# Patient Record
Sex: Male | Born: 1970
Health system: Southern US, Community
[De-identification: ages and names within clinical notes are randomized; demographics above are authoritative.]

## PROBLEM LIST (undated history)

## (undated) HISTORY — PX: APPENDECTOMY: SHX54

---

## 2002-07-25 ENCOUNTER — Ambulatory Visit (HOSPITAL_COMMUNITY): Admission: RE | Admit: 2002-07-25 | Discharge: 2002-07-25 | Payer: Self-pay | Admitting: Nurse Practitioner

## 2002-07-25 ENCOUNTER — Emergency Department (HOSPITAL_COMMUNITY): Admission: EM | Admit: 2002-07-25 | Discharge: 2002-07-25 | Payer: Self-pay | Admitting: Emergency Medicine

## 2002-07-25 ENCOUNTER — Encounter: Payer: Self-pay | Admitting: Family Medicine

## 2006-11-27 ENCOUNTER — Emergency Department (HOSPITAL_COMMUNITY): Admission: EM | Admit: 2006-11-27 | Discharge: 2006-11-27 | Payer: Self-pay | Admitting: Emergency Medicine

## 2006-11-30 ENCOUNTER — Emergency Department (HOSPITAL_COMMUNITY): Admission: EM | Admit: 2006-11-30 | Discharge: 2006-11-30 | Payer: Self-pay | Admitting: Emergency Medicine

## 2006-12-04 ENCOUNTER — Encounter (HOSPITAL_COMMUNITY): Admission: RE | Admit: 2006-12-04 | Discharge: 2007-02-27 | Payer: Self-pay | Admitting: Emergency Medicine

## 2013-02-12 ENCOUNTER — Encounter: Payer: Self-pay | Admitting: Family Medicine

## 2013-02-12 ENCOUNTER — Ambulatory Visit (INDEPENDENT_AMBULATORY_CARE_PROVIDER_SITE_OTHER): Payer: BC Managed Care – PPO | Admitting: Family Medicine

## 2013-02-12 VITALS — BP 126/90 | HR 64 | Temp 98.6°F | Resp 14 | Ht 69.0 in | Wt 228.0 lb

## 2013-02-12 DIAGNOSIS — R519 Headache, unspecified: Secondary | ICD-10-CM | POA: Insufficient documentation

## 2013-02-12 DIAGNOSIS — M542 Cervicalgia: Secondary | ICD-10-CM

## 2013-02-12 DIAGNOSIS — G43809 Other migraine, not intractable, without status migrainosus: Secondary | ICD-10-CM

## 2013-02-12 DIAGNOSIS — R51 Headache: Secondary | ICD-10-CM | POA: Insufficient documentation

## 2013-02-12 MED ORDER — CYCLOBENZAPRINE HCL 5 MG PO TABS: 10.0000 mg | ORAL_TABLET | Freq: Three times a day (TID) | ORAL | Status: DC | PRN

## 2013-02-12 MED ORDER — GABAPENTIN 300 MG PO CAPS: 300.0000 mg | ORAL_CAPSULE | Freq: Three times a day (TID) | ORAL | Status: DC

## 2013-02-12 NOTE — Progress Notes (Signed)
Subjective:    Patient ID: Gary Knight, male    DOB: Dec 18, 1970, 42 y.o.   MRN: [id number]  HPI Patient is a very pleasant 42 year old gentleman who comes in with 3 weeks of headaches. Prior to 3 weeks ago he was in excellent health. He has no significant past medical history other than an appendectomy. He takes no regular medications. He denies any hospitalizations. He reports sharp pain in the left lateral cervical spine that radiates up his occiput across his temple into his frontal scalp on the left side. The headache is constant for 3 weeks it is worse with palpation of the neck around the level of C3 and C4. He denies any injury to the neck he denies any car accidents. He denies any cervical radiculopathy in his left arm or right arm. The headache is made worse by light and sound. He has no family history of migraines or personal history of migraines. The headache is not the worse by lifting weight, straining, or Valsalva maneuvers. He denies any other neurologic deficits. No past medical history on file. No current outpatient prescriptions on file prior to visit.   No current facility-administered medications on file prior to visit.   Allergies  Allergen Reactions  . Penicillins Rash   History   Social History  . Marital Status: Single    Spouse Name: N/A    Number of Children: N/A  . Years of Education: N/A   Occupational History  . Not on file.   Social History Main Topics  . Smoking status: Never Smoker   . Smokeless tobacco: Not on file  . Alcohol Use: Yes     Comment: Beer  - Weekend only  . Drug Use: No  . Sexually Active: Not on file     Comment: married, Personnel officer.  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   Other Topics Concern  . Not on file   Social History Narrative  . No narrative on file   Family History  Problem Relation Age of Onset  . Diabetes Mother   . Hypertension Father       Review of Systems  All other systems reviewed and are negative.        Objective:   Physical Exam  Vitals reviewed. Constitutional: He is oriented to person, place, and time. He appears well-developed and well-nourished. No distress.  HENT:  Head: Normocephalic.  Right Ear: External ear normal.  Left Ear: External ear normal.  Nose: Nose normal.  Mouth/Throat: Oropharynx is clear and moist. No oropharyngeal exudate.  Eyes: Conjunctivae are normal. Pupils are equal, round, and reactive to light. Right eye exhibits no discharge. Left eye exhibits no discharge.  Neck: Normal range of motion. Neck supple.  Cardiovascular: Normal rate, regular rhythm and normal heart sounds.   No murmur heard. Pulmonary/Chest: Effort normal and breath sounds normal. No respiratory distress. He has no wheezes. He has no rales.  Lymphadenopathy:    He has no cervical adenopathy.  Neurological: He is alert and oriented to person, place, and time. He has normal reflexes. He displays normal reflexes. No cranial nerve deficit. He exhibits normal muscle tone. Coordination normal.  Skin: He is not diaphoretic.   is extremely tender to palpation in the left lateral cervical spine. It appears the headache and the pain radiates up his neck into his left occiput.        Assessment & Plan:  1. Neck pain on left side - DG Cervical Spine Complete; Future  2. Headache(784.0)  3. Migraine,  cervicogenic At this point I feel that there may be I nerve trigger on the left side of the cervical spine the string a migraine type headache. I will obtain a C-spine x-ray to evaluate for degenerative disc disease or other signs of foraminal stenosis. Started the patient on Flexeril 5-10 mg Q8 hours for muscle spasm in the cervical spine spinal muscles as the muscles are tight and tender to palpation.  I also began gabapentin 300 mg every 8 hours when necessary nervelike pain to treat the pain is radiating from his left neck up to the occiput into his left temple..  I asked the patient to take ibuprofen  800 mg every 8 hours. Recheck in one week, sooner if worse. Consider an MRI of the cervical spine if his x-ray is inconclusive. Consider a headache specialist referral for trigger point injections if medications are ineffective.

## 2013-02-17 ENCOUNTER — Ambulatory Visit
Admission: RE | Admit: 2013-02-17 | Discharge: 2013-02-17 | Disposition: A | Discharge status: Home / Self Care | Payer: BC Managed Care – PPO | Source: Ambulatory Visit | Attending: Family Medicine | Admitting: Family Medicine

## 2013-02-17 DIAGNOSIS — M542 Cervicalgia: Secondary | ICD-10-CM

## 2013-02-19 ENCOUNTER — Other Ambulatory Visit: Payer: Self-pay | Admitting: Family Medicine

## 2013-02-19 DIAGNOSIS — R51 Headache: Secondary | ICD-10-CM

## 2013-08-12 ENCOUNTER — Ambulatory Visit (INDEPENDENT_AMBULATORY_CARE_PROVIDER_SITE_OTHER): Payer: BC Managed Care – PPO

## 2013-08-12 ENCOUNTER — Ambulatory Visit (INDEPENDENT_AMBULATORY_CARE_PROVIDER_SITE_OTHER): Payer: BC Managed Care – PPO | Admitting: Podiatry

## 2013-08-12 ENCOUNTER — Encounter: Payer: Self-pay | Admitting: Podiatry

## 2013-08-12 VITALS — BP 124/77 | HR 69 | Resp 12 | Ht 69.0 in | Wt 215.0 lb

## 2013-08-12 DIAGNOSIS — M722 Plantar fascial fibromatosis: Secondary | ICD-10-CM

## 2013-08-12 MED ORDER — PREDNISONE 10 MG PO KIT: PACK | ORAL | Status: DC

## 2013-08-12 NOTE — Patient Instructions (Signed)
Remove taping on left foot after 5 days. Began wearing a fasciitis strap at that time. Return for scanning for custom orthotics.  Plantar Fasciitis (Heel Spur Syndrome) with Rehab The plantar fascia is a fibrous, ligament-like, soft-tissue structure that spans the bottom of the foot. Plantar fasciitis is a condition that causes pain in the foot due to inflammation of the tissue. SYMPTOMS   Pain and tenderness on the underneath side of the foot.  Pain that worsens with standing or walking. CAUSES  Plantar fasciitis is caused by irritation and injury to the plantar fascia on the underneath side of the foot. Common mechanisms of injury include:  Direct trauma to bottom of the foot.  Damage to a small nerve that runs under the foot where the main fascia attaches to the heel bone.  Stress placed on the plantar fascia due to bone spurs. RISK INCREASES WITH:   Activities that place stress on the plantar fascia (running, jumping, pivoting, or cutting).  Poor strength and flexibility.  Improperly fitted shoes.  Tight calf muscles.  Flat feet.  Failure to warm-up properly before activity.  Obesity. PREVENTION  Warm up and stretch properly before activity.  Allow for adequate recovery between workouts.  Maintain physical fitness:  Strength, flexibility, and endurance.  Cardiovascular fitness.  Maintain a health body weight.  Avoid stress on the plantar fascia.  Wear properly fitted shoes, including arch supports for individuals who have flat feet. PROGNOSIS  If treated properly, then the symptoms of plantar fasciitis usually resolve without surgery. However, occasionally surgery is necessary. RELATED COMPLICATIONS   Recurrent symptoms that may result in a chronic condition.  Problems of the lower back that are caused by compensating for the injury, such as limping.  Pain or weakness of the foot during push-off following surgery.  Chronic inflammation, scarring, and  partial or complete fascia tear, occurring more often from repeated injections. TREATMENT  Treatment initially involves the use of ice and medication to help reduce pain and inflammation. The use of strengthening and stretching exercises may help reduce pain with activity, especially stretches of the Achilles tendon. These exercises may be performed at home or with a therapist. Your caregiver may recommend that you use heel cups of arch supports to help reduce stress on the plantar fascia. Occasionally, corticosteroid injections are given to reduce inflammation. If symptoms persist for greater than 6 months despite non-surgical (conservative), then surgery may be recommended.  MEDICATION   If pain medication is necessary, then nonsteroidal anti-inflammatory medications, such as aspirin and ibuprofen, or other minor pain relievers, such as acetaminophen, are often recommended.  Do not take pain medication within 7 days before surgery.  Prescription pain relievers may be given if deemed necessary by your caregiver. Use only as directed and only as much as you need.  Corticosteroid injections may be given by your caregiver. These injections should be reserved for the most serious cases, because they may only be given a certain number of times. HEAT AND COLD  Cold treatment (icing) relieves pain and reduces inflammation. Cold treatment should be applied for 10 to 15 minutes every 2 to 3 hours for inflammation and pain and immediately after any activity that aggravates your symptoms. Use ice packs or massage the area with a piece of ice (ice massage).  Heat treatment may be used prior to performing the stretching and strengthening activities prescribed by your caregiver, physical therapist, or athletic trainer. Use a heat pack or soak the injury in warm water. SEEK IMMEDIATE  MEDICAL CARE IF:  Treatment seems to offer no benefit, or the condition worsens.  Any medications produce adverse side  effects. EXERCISES RANGE OF MOTION (ROM) AND STRETCHING EXERCISES - Plantar Fasciitis (Heel Spur Syndrome) These exercises may help you when beginning to rehabilitate your injury. Your symptoms may resolve with or without further involvement from your physician, physical therapist or athletic trainer. While completing these exercises, remember:   Restoring tissue flexibility helps normal motion to return to the joints. This allows healthier, less painful movement and activity.  An effective stretch should be held for at least 30 seconds.  A stretch should never be painful. You should only feel a gentle lengthening or release in the stretched tissue. RANGE OF MOTION - Toe Extension, Flexion  Sit with your right / left leg crossed over your opposite knee.  Grasp your toes and gently pull them back toward the top of your foot. You should feel a stretch on the bottom of your toes and/or foot.  Hold this stretch for __________ seconds.  Now, gently pull your toes toward the bottom of your foot. You should feel a stretch on the top of your toes and or foot.  Hold this stretch for __________ seconds. Repeat __________ times. Complete this stretch __________ times per day.  RANGE OF MOTION - Ankle Dorsiflexion, Active Assisted  Remove shoes and sit on a chair that is preferably not on a carpeted surface.  Place right / left foot under knee. Extend your opposite leg for support.  Keeping your heel down, slide your right / left foot back toward the chair until you feel a stretch at your ankle or calf. If you do not feel a stretch, slide your bottom forward to the edge of the chair, while still keeping your heel down.  Hold this stretch for __________ seconds. Repeat __________ times. Complete this stretch __________ times per day.  STRETCH  Gastroc, Standing  Place hands on wall.  Extend right / left leg, keeping the front knee somewhat bent.  Slightly point your toes inward on your back  foot.  Keeping your right / left heel on the floor and your knee straight, shift your weight toward the wall, not allowing your back to arch.  You should feel a gentle stretch in the right / left calf. Hold this position for __________ seconds. Repeat __________ times. Complete this stretch __________ times per day. STRETCH  Soleus, Standing  Place hands on wall.  Extend right / left leg, keeping the other knee somewhat bent.  Slightly point your toes inward on your back foot.  Keep your right / left heel on the floor, bend your back knee, and slightly shift your weight over the back leg so that you feel a gentle stretch deep in your back calf.  Hold this position for __________ seconds. Repeat __________ times. Complete this stretch __________ times per day. STRETCH  Gastrocsoleus, Standing  Note: This exercise can place a lot of stress on your foot and ankle. Please complete this exercise only if specifically instructed by your caregiver.   Place the ball of your right / left foot on a step, keeping your other foot firmly on the same step.  Hold on to the wall or a rail for balance.  Slowly lift your other foot, allowing your body weight to press your heel down over the edge of the step.  You should feel a stretch in your right / left calf.  Hold this position for __________ seconds.  Repeat this exercise with a slight bend in your right / left knee. Repeat __________ times. Complete this stretch __________ times per day.  STRENGTHENING EXERCISES - Plantar Fasciitis (Heel Spur Syndrome)  These exercises may help you when beginning to rehabilitate your injury. They may resolve your symptoms with or without further involvement from your physician, physical therapist or athletic trainer. While completing these exercises, remember:   Muscles can gain both the endurance and the strength needed for everyday activities through controlled exercises.  Complete these exercises as  instructed by your physician, physical therapist or athletic trainer. Progress the resistance and repetitions only as guided. STRENGTH - Towel Curls  Sit in a chair positioned on a non-carpeted surface.  Place your foot on a towel, keeping your heel on the floor.  Pull the towel toward your heel by only curling your toes. Keep your heel on the floor.  If instructed by your physician, physical therapist or athletic trainer, add ____________________ at the end of the towel. Repeat __________ times. Complete this exercise __________ times per day. STRENGTH - Ankle Inversion  Secure one end of a rubber exercise band/tubing to a fixed object (table, pole). Loop the other end around your foot just before your toes.  Place your fists between your knees. This will focus your strengthening at your ankle.  Slowly, pull your big toe up and in, making sure the band/tubing is positioned to resist the entire motion.  Hold this position for __________ seconds.  Have your muscles resist the band/tubing as it slowly pulls your foot back to the starting position. Repeat __________ times. Complete this exercises __________ times per day.  Document Released: 08/13/2005 Document Revised: 11/05/2011 Document Reviewed: 11/25/2008 [facility] Patient Information 2014 [place], [place].

## 2013-08-12 NOTE — Progress Notes (Signed)
   Subjective:    Patient ID: Gary Knight, male    DOB: 1970/09/17, 42 y.o.   MRN: [id number]  Foot Pain This is a new problem. The current episode started more than 1 month ago. The problem occurs constantly. The problem has been gradually worsening. Associated symptoms comments: BURNING SENSATION. The symptoms are aggravated by walking and standing. He has tried nothing for the symptoms. The treatment provided no relief.      Review of Systems  HENT: Positive for sinus pressure.   Eyes: Positive for visual disturbance.  Musculoskeletal: Positive for back pain and gait problem.       Objective:   Physical Exam  Orientated x29 42 year old white male presents with his wife  Vascular: The DP and PT pulses are two over four bilaterally  Neurological: Knee and ankle reflexes equal and reactive bilaterally  Dermatological: Texture and turgor within normal limits bilaterally  Musculoskeletal: Exquisite palpable tenderness in the mid arch fascial band left, without any palpable lesions. This duplicates her area of discomfort. Weightbearing demonstrates painful gait favoring left foot. No restriction ankle subtalar midtarsal joints bilaterally.  X-ray demonstrates no heel spur formation with mild metatarsus adductus deformity noted.        Assessment & Plan:   Assessment: Severe plantar fasciitis left  Plan: Fascial taping applied left. After removal in 5 days apply the fasciitis strap that was dispensed. Patient advised to purchase a shoe with a steel shank. Shoeing and stretching discussed Prednisone 10 mg six-day tapering dose pack prescribed. Return for casting for accommodative orthotic for the indication of plantar fasciitis left. Will use a sport orthotic with a extrinsic foot post to vertical and forefoot post intrinsic to cast. Mid plaster from medial arch. Deep heel seat bilaterally. Return for scanning for custom orthotic as described above.

## 2013-08-24 ENCOUNTER — Other Ambulatory Visit: Payer: BC Managed Care – PPO

## 2013-08-31 ENCOUNTER — Encounter: Payer: Self-pay | Admitting: Podiatry

## 2013-08-31 ENCOUNTER — Ambulatory Visit (INDEPENDENT_AMBULATORY_CARE_PROVIDER_SITE_OTHER): Payer: BC Managed Care – PPO | Admitting: Podiatry

## 2013-08-31 VITALS — BP 116/76 | HR 78 | Resp 16 | Ht 69.0 in | Wt 220.0 lb

## 2013-08-31 DIAGNOSIS — M722 Plantar fascial fibromatosis: Secondary | ICD-10-CM

## 2013-08-31 MED ORDER — TRIAMCINOLONE ACETONIDE 10 MG/ML IJ SUSP
10.0000 mg | Freq: Once | INTRAMUSCULAR | Status: AC
  Administered 2013-08-31: 10 mg

## 2013-08-31 NOTE — Progress Notes (Signed)
Patient ID: Gary Knight, male   DOB: 06-21-1971, 43 y.o.   MRN: [id number]  Subjective: Patient presents for followup care for plantar fasciitis left. He had a great deal of relief from fascial taping. He has been skin for orthotics. He said that he became constipated after 2 days of prednisone and he discontinued the prednisone.  Objective: Orientated x65 43 year old white male. The left plantar fascia standard at the heel and midportion without a palpable lesions.  Assessment: Plantar fasciitis left Appeared GI intolerance of prednisone  Plan: Skin is prepped with alcohol and Betadine and 10 mg of Kenalog mixed with 10 mg of plain Xylocaine and 2.5 mg of plain Marcaine are injected into the inferior heel left for Kenalog injection #1. Fasciitis strap applied to the left foot. He'll remove the dressing in 5 days and then began wearing the plantar fascial strap. He'll be notified when he will custom orthotics arrive. If the fasciitis exacerbates prior to the arrival of the orthotics offered patient plantar fascial taping at his request. Also recommended over-the-counter NSAIDs for pain control.  Reappoint up on receipt of custom orthotics.    Marland Kitchen

## 2013-08-31 NOTE — Patient Instructions (Signed)
Remove Bandage on left foot and 5 days and begin wearing fasciitis strap. Return to wearing orthotics arrive or if you want to have an additional fasciitis strap applied.

## 2013-10-07 ENCOUNTER — Encounter: Payer: Self-pay | Admitting: Podiatry

## 2013-10-07 ENCOUNTER — Ambulatory Visit (INDEPENDENT_AMBULATORY_CARE_PROVIDER_SITE_OTHER): Payer: BC Managed Care – PPO | Admitting: Podiatry

## 2013-10-07 VITALS — BP 117/77 | HR 84 | Resp 12

## 2013-10-07 DIAGNOSIS — M722 Plantar fascial fibromatosis: Secondary | ICD-10-CM

## 2013-10-08 NOTE — Progress Notes (Signed)
Patient ID: Gary Knight, male   DOB: 03-Feb-1971, 43 y.o.   MRN: [id number]  Subjective: Patient presents for followup care for plantar fasciitis left. The left heell has improved significantly since he Kenalog Injection and fasciitis strap was dispensed.  Objective: There is mild palpable tenderness in the medial plantar heel without a palpable lesions. The custom foot orthotics contour satisfactorily  Assessment: Improving plantar fasciitis left  Plan: The orthotics were dispensed with where instructions provided. He will continue to wear the fasciitis strap ORTHOTIC INSTRUCTIONS   1) The shoe size most likely will increase 1/2 to one full size with your orthotic.  2) If the orthotic is full length, remove the shoe liner and replace it with your orthotic inside your shoe.  3) When purchasing new shoes, go midday and wear the thickness of socks you plan to wear with the shoe. Place orthotics in the shoes based on comfort. Orthotics may not fit in all shoe styles.  4) Wear the orthotic as long as they are comfortable and then remove. Restart the next day and continue this until you can wear the orthotic comfortably all day. Reappoint in 6 weeks.

## 2013-10-08 NOTE — Patient Instructions (Signed)

## 2013-11-18 ENCOUNTER — Encounter: Payer: Self-pay | Admitting: Podiatry

## 2013-11-18 ENCOUNTER — Ambulatory Visit (INDEPENDENT_AMBULATORY_CARE_PROVIDER_SITE_OTHER): Payer: BC Managed Care – PPO | Admitting: Podiatry

## 2013-11-18 VITALS — BP 111/60 | HR 74 | Resp 12

## 2013-11-18 DIAGNOSIS — M722 Plantar fascial fibromatosis: Secondary | ICD-10-CM

## 2013-11-18 MED ORDER — TRIAMCINOLONE ACETONIDE 10 MG/ML IJ SUSP
10.0000 mg | Freq: Once | INTRAMUSCULAR | Status: AC
  Administered 2013-11-18: 10 mg

## 2013-11-18 NOTE — Patient Instructions (Signed)
Wear orthotics as long as they're comfortable and remove if uncomfortable. Wear the fasciitis strap when not wearing the orthotic.

## 2013-11-19 NOTE — Progress Notes (Signed)
Patient ID: Gary Knight, male   DOB: Feb 14, 1971, 43 y.o.   MRN: [id number]  Subjective: This patient presents for followup care for left plantar fasciitis under care since 08/12/2014. He has said 1 Kenalog Injection inferior left heel, currently wearing orthotics. In addition he has a plantar fasciitis strap. The symptoms seem to improve however since the visit of 10/08/2013 the symptoms have exacerbated. 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  Objective: The medial plantar aspect of left heel is tender to palpation without a palpable lesions. There is no erythema, edema or skin lesions noted in the heel area on the right foot.  Assessment: Exacerbation of symptoms associated with plantar fasciitis, left foot  Plan: Skin is prepped with alcohol and Betadine and 10 mg of Kenalog mixed with 10 mg of Xylocaine and 2.5 mg of plain Marcaine are injected into the inferior heel of the left foot for Kenalog injection #2.  He is advised to wear the orthotics as long as they are comfortable and remove when uncomfortable. Substitute the fasciitis strap when he moves the orthotic.  Maintain stretching, ice massage.  Reappoint times 2 months.

## 2014-01-13 ENCOUNTER — Ambulatory Visit (INDEPENDENT_AMBULATORY_CARE_PROVIDER_SITE_OTHER): Payer: BC Managed Care – PPO | Admitting: Podiatry

## 2014-01-13 ENCOUNTER — Encounter: Payer: Self-pay | Admitting: Podiatry

## 2014-01-13 VITALS — BP 134/70 | HR 62 | Resp 14 | Ht 70.0 in | Wt 215.0 lb

## 2014-01-13 DIAGNOSIS — M722 Plantar fascial fibromatosis: Secondary | ICD-10-CM

## 2014-01-13 NOTE — Patient Instructions (Signed)
Using over-the-counter 200 mg ibuprofen tablets , 2 tablets by mouth 3 times a day x14 days Continue to wear fasciitis strap Plantar Fasciitis Plantar fasciitis is a common condition that causes foot pain. It is soreness (inflammation) of the band of tough fibrous tissue on the bottom of the foot that runs from the heel bone (calcaneus) to the ball of the foot. The cause of this soreness may be from excessive standing, poor fitting shoes, running on hard surfaces, being overweight, having an abnormal walk, or overuse (this is common in runners) of the painful foot or feet. It is also common in aerobic exercise dancers and ballet dancers. SYMPTOMS  Most people with plantar fasciitis complain of:  Severe pain in the morning on the bottom of their foot especially when taking the first steps out of bed. This pain recedes after a few minutes of walking.  Severe pain is experienced also during walking following a long period of inactivity.  Pain is worse when walking barefoot or up stairs DIAGNOSIS   Your caregiver will diagnose this condition by examining and feeling your foot.  Special tests such as X-rays of your foot, are usually not needed. PREVENTION   Consult a sports medicine professional before beginning a new exercise program.  Walking programs offer a good workout. With walking there is a lower chance of overuse injuries common to runners. There is less impact and less jarring of the joints.  Begin all new exercise programs slowly. If problems or pain develop, decrease the amount of time or distance until you are at a comfortable level.  Wear good shoes and replace them regularly.  Stretch your foot and the heel cords at the back of the ankle (Achilles tendon) both before and after exercise.  Run or exercise on even surfaces that are not hard. For example, asphalt is better than pavement.  Do not run barefoot on hard surfaces.  If using a treadmill, vary the incline.  Do not  continue to workout if you have foot or joint problems. Seek professional help if they do not improve. HOME CARE INSTRUCTIONS   Avoid activities that cause you pain until you recover.  Use ice or cold packs on the problem or painful areas after working out.  Only take over-the-counter or prescription medicines for pain, discomfort, or fever as directed by your caregiver.  Soft shoe inserts or athletic shoes with air or gel sole cushions may be helpful.  If problems continue or become more severe, consult a sports medicine caregiver or your own health care provider. Cortisone is a potent anti-inflammatory medication that may be injected into the painful area. You can discuss this treatment with your caregiver. MAKE SURE YOU:   Understand these instructions.  Will watch your condition.  Will get help right away if you are not doing well or get worse. Document Released: 05/08/2001 Document Revised: 11/05/2011 Document Reviewed: 07/07/2008 [facility] Patient Information 2014 [place], [place].

## 2014-01-13 NOTE — Progress Notes (Signed)
   Subjective:    Patient ID: Gary Knight, male    DOB: September 05, 1970, 43 y.o.   MRN: [id number]  HPI Comments: Pt states after 3 - 4 hour on his feet the pain is worse than in the beginning.  Pt states he feels his foot is slipping forward on the orthotic causing increase of pain in the forefoot.  Pt continues to wear the plantar fascial brace.  This patient presents for followup care for left plantar fasciitis under care since 08/12/2014. He said 2 Kenalog injections into the inferior left heel with some temporary relief. He is able to tolerate the plantar fasciitis strap which is beginning to stretch in the Velcro weakening over time. 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 He says that he stands and walks continuously on a daily basis for many hours.   Review of Systems     Objective:   Physical Exam  Orientated x3 white male  This visit palpable tenderness in the medial plantar left heel and mid fascial band left without a palpable lesions. The overlying skin texture and turgor of the left foot is within normal.      Assessment & Plan:   Assessment: Plantar fasciitis left Intolerance of hard material in the existing custom orthotic  Plan: Am recommending a semirigid full length functional orthotic to replace the rigid orthotic. Patient would like to have this orthotic Digital scan obtained today for 3-D semirigid orthotic full length, with Spenco top cover  Patient advised to take over-the-counter ibuprofen tablets by mouth 2 tablets 3 times a day x14 days Replacement fasciitis strap dispensed Maintain stretching and ice massage  Notify patient up on receipt of replacement custom orthotic and consider an additional Kenalog injection at that time.

## 2014-01-14 ENCOUNTER — Encounter: Payer: Self-pay | Admitting: Podiatry

## 2014-02-08 ENCOUNTER — Encounter: Payer: Self-pay | Admitting: Podiatry

## 2014-02-08 ENCOUNTER — Ambulatory Visit (INDEPENDENT_AMBULATORY_CARE_PROVIDER_SITE_OTHER): Payer: BC Managed Care – PPO | Admitting: Podiatry

## 2014-02-08 VITALS — BP 124/74 | HR 69 | Resp 12

## 2014-02-08 DIAGNOSIS — M722 Plantar fascial fibromatosis: Secondary | ICD-10-CM

## 2014-02-08 MED ORDER — TRIAMCINOLONE ACETONIDE 10 MG/ML IJ SUSP
10.0000 mg | Freq: Once | INTRAMUSCULAR | Status: AC
  Administered 2014-02-08: 10 mg

## 2014-02-08 NOTE — Patient Instructions (Signed)

## 2014-02-09 NOTE — Progress Notes (Signed)
Patient ID: Gary Knight, male   DOB: [birth year], 43 y.o.   MRN: [id number]  Subjective: This patient presents for ongoing care for plantar fasciitis left. He said 2 Kenalog injections into the inferior left heel with temporary improvement. He was not able to tolerate a rigid orthotic. He currently is wearing a fasciitis strap.  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  Objective: The left heel is unremarkable in appearance with tender to palpation in the medial plantar aspect. Semirigid full length custom orthotics contour satisfactorily  Assessment: Unapproved plantar fasciitis left  Plan: The skin is prepped with alcohol and Betadine and 10 mg of Kenalog mixed with 10 mg of plain Xylocaine and 2.5 mg of plain Marcaine were injected into the inferior heel left for Kenalog injection #3  Custom orthotics semirigid dispensed with wearing structure provided Continue to wear fasciitis strap with custom orthotic Maintain stretching Maintain over-the-counter ibuprofen tablets 2 tablets 3 times a day an additional 14 days  Recheck x6 weeks

## 2014-04-05 ENCOUNTER — Ambulatory Visit: Payer: BC Managed Care – PPO | Admitting: Podiatry

## 2014-04-28 ENCOUNTER — Ambulatory Visit: Payer: BC Managed Care – PPO | Admitting: Podiatry

## 2016-11-05 ENCOUNTER — Ambulatory Visit: Payer: Self-pay | Admitting: Podiatry

## 2016-11-07 ENCOUNTER — Ambulatory Visit (INDEPENDENT_AMBULATORY_CARE_PROVIDER_SITE_OTHER): Payer: Commercial Managed Care - HMO | Admitting: Podiatry

## 2016-11-07 ENCOUNTER — Ambulatory Visit (INDEPENDENT_AMBULATORY_CARE_PROVIDER_SITE_OTHER): Payer: Commercial Managed Care - HMO

## 2016-11-07 ENCOUNTER — Encounter: Payer: Self-pay | Admitting: Podiatry

## 2016-11-07 VITALS — Wt 230.0 lb

## 2016-11-07 DIAGNOSIS — M722 Plantar fascial fibromatosis: Secondary | ICD-10-CM | POA: Diagnosis not present

## 2016-11-07 MED ORDER — PREDNISONE 10 MG PO TABS: ORAL_TABLET | ORAL | 0 refills | Status: DC

## 2016-11-07 MED ORDER — TRIAMCINOLONE ACETONIDE 10 MG/ML IJ SUSP
10.0000 mg | Freq: Once | INTRAMUSCULAR | Status: AC
  Administered 2016-11-07: 10 mg

## 2016-11-07 NOTE — Patient Instructions (Signed)

## 2016-11-09 NOTE — Progress Notes (Signed)
Subjective:     Patient ID: Gary Knight, male   DOB: 25-Feb-1971, 46 y.o.   MRN: [id number]  HPI patient presents with significant pain in both feet and also requiring new orthotics due to the pain he is experiencing   Review of Systems     Objective:   Physical Exam Neurovascular status intact negative Homan sign was noted with patient found to have significant deformity of the plantar heel region bilateral with moderate depression of the arch fluid buildup and pain with palpation.    Assessment:     Chronic fasciitis with acute flareup bilateral heel    Plan:     H&P conditions reviewed and today I reviewed x-rays. I then injected the fascial band bilateral at the insertion 3 mg Kenalog 5 mill grams Xylocaine advised on fascial brace which were dispensed today and physical therapy. Reappoint in 2 weeks and will require new orthotics  X-ray report indicates that there is moderate depression of the arch with significant discomfort plantar heel region bilateral and spur formation

## 2016-11-21 ENCOUNTER — Ambulatory Visit: Payer: Commercial Managed Care - HMO | Admitting: *Deleted

## 2016-11-21 DIAGNOSIS — M722 Plantar fascial fibromatosis: Secondary | ICD-10-CM

## 2016-11-21 NOTE — Progress Notes (Unsigned)
Patient presents with bilateral plantar fasc and heel pain.. Goal is to provide cushioned F/O with long. Arch support and rear heel stability..  Plan on Richie to fabricate 2D device w/ poron/spenco cushioning.Marland KitchenMarland KitchenMarland Kitchen

## 2016-12-11 ENCOUNTER — Other Ambulatory Visit: Payer: Commercial Managed Care - HMO

## 2018-03-26 ENCOUNTER — Encounter: Payer: Self-pay | Admitting: Sports Medicine

## 2018-03-26 ENCOUNTER — Telehealth: Payer: Self-pay | Admitting: Sports Medicine

## 2018-03-26 ENCOUNTER — Ambulatory Visit (INDEPENDENT_AMBULATORY_CARE_PROVIDER_SITE_OTHER): Payer: 59

## 2018-03-26 ENCOUNTER — Ambulatory Visit: Payer: 59 | Admitting: Sports Medicine

## 2018-03-26 VITALS — BP 136/82 | HR 75 | Ht 69.0 in | Wt 219.2 lb

## 2018-03-26 DIAGNOSIS — M25561 Pain in right knee: Secondary | ICD-10-CM

## 2018-03-26 DIAGNOSIS — M5441 Lumbago with sciatica, right side: Secondary | ICD-10-CM | POA: Diagnosis not present

## 2018-03-26 DIAGNOSIS — M542 Cervicalgia: Secondary | ICD-10-CM

## 2018-03-26 DIAGNOSIS — G8929 Other chronic pain: Secondary | ICD-10-CM

## 2018-03-26 DIAGNOSIS — M48061 Spinal stenosis, lumbar region without neurogenic claudication: Secondary | ICD-10-CM | POA: Diagnosis not present

## 2018-03-26 DIAGNOSIS — M5136 Other intervertebral disc degeneration, lumbar region: Secondary | ICD-10-CM | POA: Diagnosis not present

## 2018-03-26 DIAGNOSIS — M25461 Effusion, right knee: Secondary | ICD-10-CM | POA: Diagnosis not present

## 2018-03-26 MED ORDER — METHYLPREDNISOLONE 4 MG PO TBPK: ORAL_TABLET | ORAL | 0 refills | Status: DC

## 2018-03-26 NOTE — Progress Notes (Signed)
Gary Knight. Gary Knight, [place] [facility] [place]  SIR MALLIS - 47 y.o. male MRN [id number]  Date of birth: 1970-12-02  Visit Date: 03/26/2018  PCP: Susy Frizzle, MD   Referred by: Susy Frizzle, MD  Scribe(s) for today's visit: Josepha Pigg, CMA  SUBJECTIVE:  Guy Gary "[name]" is here for Initial Assessment   His neck pain symptoms INITIALLY: Began about 3-2 months ago and MOI is unknown.  Described as mild-moderate dull aching, radiating to his back and R leg. He also c/o R-sided LBP. Pain is the R leg is moderate and is worse with walking.  Worsened with walking, jarring, turning head side-to-side.  Improved with rest.  Additional associated symptoms include: He has noticed a lot of popping in his neck when he looks to the L.He c/o lump on the L side of his neck which developed after hitting his head on his car. He denies loss of control on bladder or bowel functions. He denies visual changes. He reports occasional dizziness but denies HA.     At this time symptoms are worsening compared to onset, he hit his head on his car last Thursday and heard a loud pop. Pain and popping has gotten worse since then.  He has been seeing a massage therapist and they advised him that he has inflammation down his spine and they didn't feel comfortable working on him until he was seen by medical provider. He has not tried any medications for pain.   He also c/o R knee pain Pain began several months ago but worsened over the weekend when he turned wrong getting in his kayak. Pain is moderate-severe aching, pulling. Pain does not radiate. Worse with walking, crossing legs, flexion.  Improves with rest Additional sx: He reports swelling about the R knee. He denies popping, clicking. He also reports some weakness in the R knee.   He has not tried any medications or other modalities for knee pain.    REVIEW OF  SYSTEMS: Denies night time disturbances. Denies fevers, chills, or night sweats. Denies unexplained weight loss. Denies personal history of cancer. Denies changes in bowel or bladder habits. Denies recent unreported falls. Reports new or worsening dyspnea. Reports dizziness.  Reports weakness in R leg Reports dizziness or presyncopal episodes Reports lower extremity edema    HISTORY & PERTINENT PRIOR DATA:  Prior History reviewed and updated per electronic medical record.  Significant/pertinent history, findings, studies include:  reports that he has never smoked. He has never used smokeless tobacco. No results for input(s): HGBA1C, LABURIC, CREATINE in the last 8760 hours. No specialty comments available. No problems updated.  OBJECTIVE:  VS:  HT:5\' 9"  (175.3 cm)   WT:219 lb 3.2 oz (99.4 kg)  BMI:32.36    BP:136/82  HR:75bpm  TEMP: ( )  RESP:97 %   PHYSICAL EXAM: Constitutional: WDWN, Non-toxic appearing. Psychiatric: Alert & appropriately interactive.  Not depressed or anxious appearing. Respiratory: No increased work of breathing.  Trachea Midline Eyes: Pupils are equal.  EOM intact without nystagmus.  No scleral icterus  Vascular Exam: warm to touch no edema  MSK Exam: BACK Exam: Normal alignment & Contours Skin: No overlying erythema/ecchymosis  MOTOR TESTING: Intact in all LE myotomes and Able to heel and toe walk without difficutly        RIGHT    LEFT Straight leg raise-------------------------: positive, moderate pain  positive, mild pain Braggard Stretch Test------------------: positive, moderate pain                         positive, moderate pain Slump Sign--------------------------------: positive, mild pain                         positive, mild pain Popliteal compression test------------: positive, moderate pain                         normal, no pain  REFLEXES Right Left  DTR - L3/4 -Patellar 3+ 2+  DTR - L5/S1 -  Achilles 3+ 2+     ASSESSMENT & PLAN:   1. Chronic pain of right knee   2. Right-sided low back pain with right-sided sciatica, unspecified chronicity   3. Neck pain     PLAN:    Symptoms are consistent with cervical and lumbar functional tightness.  X-rays reviewed with the patient today.  Mild degenerative changes of all 3 areas imaged including cervical spine, lumbar spine and knee.  Next  Systemic treatment with Medrol Dosepak  Links to [employer] provided today per Patient Instructions.  These exercises were developed by Minerva Ends, DC with a strong emphasis on core neuromuscular reducation and postural realignment through body-weight exercises.    Discussed the underlying features of tight hip flexors leading to crouched, fetal like position that results in spinal column compression.  Including lumbar hyperflexion with hypermobility, thoracic flexion with restrictive rotation and cervical lordosis reversal  Follow-up: Return in about 2 weeks (around 04/09/2018) for repeat clinical exam.      Please see additional documentation for Objective, Assessment and Plan sections. Pertinent additional documentation may be included in corresponding procedure notes, imaging studies, problem based documentation and patient instructions. Please see these sections of the encounter for additional information regarding this visit.  CMA/ATC served as Education administrator during this visit. History, Physical, and Plan performed by medical provider. Documentation and orders reviewed and attested to.      Gerda Diss, [place] [facility] Physician

## 2018-03-26 NOTE — Telephone Encounter (Signed)
Per the pharmacy the Rx sent by Dr Paulla Fore is on back order and needs to be change. They told the [facility] Dr Paulla Fore needs to change it to a different therapy?

## 2018-03-26 NOTE — Patient Instructions (Addendum)

## 2018-03-27 NOTE — Telephone Encounter (Signed)
Called pt's pharmacy to verify situation regarding the Medrol dosepack and was informed that they have one bottle of free 4mg  tablets which can be dosed identically to the original dosepack prescription.  Pharmacy confirmed that they will contact the pt to let him know that he can pick up rx at his convenience.

## 2018-03-31 ENCOUNTER — Encounter: Payer: Self-pay | Admitting: Sports Medicine

## 2018-04-09 ENCOUNTER — Ambulatory Visit: Payer: 59 | Admitting: Sports Medicine

## 2018-04-09 ENCOUNTER — Ambulatory Visit: Payer: Self-pay

## 2018-04-09 ENCOUNTER — Encounter: Payer: Self-pay | Admitting: Sports Medicine

## 2018-04-09 VITALS — BP 110/78 | HR 79 | Ht 69.0 in | Wt 220.4 lb

## 2018-04-09 DIAGNOSIS — M9902 Segmental and somatic dysfunction of thoracic region: Secondary | ICD-10-CM | POA: Diagnosis not present

## 2018-04-09 DIAGNOSIS — M25561 Pain in right knee: Secondary | ICD-10-CM

## 2018-04-09 DIAGNOSIS — M9901 Segmental and somatic dysfunction of cervical region: Secondary | ICD-10-CM

## 2018-04-09 DIAGNOSIS — G8929 Other chronic pain: Secondary | ICD-10-CM | POA: Diagnosis not present

## 2018-04-09 DIAGNOSIS — M9908 Segmental and somatic dysfunction of rib cage: Secondary | ICD-10-CM

## 2018-04-09 DIAGNOSIS — M542 Cervicalgia: Secondary | ICD-10-CM | POA: Diagnosis not present

## 2018-04-09 NOTE — Progress Notes (Signed)
[name], [place] [facility] [place]  TABER SWEETSER - 47 y.o. male MRN [id number]  Date of birth: [birth year]  Visit Date: 04/09/2018  PCP: Susy Frizzle, MD   Referred by: Susy Frizzle, MD  Scribe(s) for today's visit: Wendy Poet, LAT, ATC  SUBJECTIVE:  Gary Knight" is here for Follow-up (R knee and neck pain) .    His neck pain symptoms INITIALLY: Began about 3-2 months ago and MOI is unknown.  Described as mild-moderate dull aching, radiating to his back and R leg. He also c/o R-sided LBP. Pain is the R leg is moderate and is worse with walking.  Worsened with walking, jarring, turning head side-to-side.  Improved with rest.  Additional associated symptoms include: He has noticed a lot of popping in his neck when he looks to the L.He c/o lump on the L side of his neck which developed after hitting his head on his car. He denies loss of control on bladder or bowel functions. He denies visual changes. He reports occasional dizziness but denies HA.     At this time symptoms are worsening compared to onset, he hit his head on his car last Thursday and heard a loud pop. Pain and popping has gotten worse since then.  He has been seeing a massage therapist and they advised him that he has inflammation down his spine and they didn't feel comfortable working on him until he was seen by medical provider. He has not tried any medications for pain.   He also c/o R knee pain Pain began several months ago but worsened over the weekend when he turned wrong getting in his kayak. Pain is moderate-severe aching, pulling. Pain does not radiate. Worse with walking, crossing legs, flexion.  Improves with rest Additional sx: He reports swelling about the R knee. He denies popping, clicking. He also reports some weakness in the R knee.   He has not tried any medications or other modalities for knee pain.    04/09/2018: Neck pain- Compared to the last office visit on 03/26/18, his previously described neck pain symptoms show no change.  He states that his neck con't to pop and crack.  He reports having a fairly constant HA in his L frontal/temporal lobe area. Current symptoms are moderate dull, aching pain & are nonradiating He has been taking Advil or Aleve prn.  He was prescribed a steroid dose pack at his last visit which he has completed.  R knee pain- Compared to the last office visit, his previously described R knee pain symptoms show no change Current symptoms are mod-severe aching and pulling & are nonradiating He has been taking Advil or Aleve prn.  He was prescribed a steroid dose pack at his last visit which he has completed.    REVIEW OF SYSTEMS: Reports night time disturbances. Denies fevers, chills, or night sweats. Denies unexplained weight loss. Denies personal history of cancer. Denies changes in bowel or bladder habits. Denies recent unreported falls. Reports new or worsening dyspnea or wheezing. Reports headaches or dizziness.  Reports numbness, tingling or weakness  In the extremities - R LE weakness Reports dizziness or presyncopal episodes Reports lower extremity edema    HISTORY & PERTINENT PRIOR DATA:  Prior History reviewed and updated per electronic medical record.  Significant/pertinent history, findings, studies include:  reports that he has never smoked. He has never used smokeless tobacco. No results for input(s): HGBA1C,  LABURIC, CREATINE in the last 8760 hours. No specialty comments available. No problems updated.  OBJECTIVE:  VS:  HT:5\' 9"  (175.3 cm)   WT:220 lb 6.4 oz (100 kg)  BMI:32.53    BP:110/78  HR:79bpm  TEMP: ( )  RESP:96 %   PHYSICAL EXAM: CONSTITUTIONAL: Well-developed, Well-nourished and In no acute distress Alert & appropriately interactive. and Not depressed or anxious appearing. Respiratory: No increased work of breathing.   Trachea Midline EYES: Pupils are equal., EOM intact without nystagmus. and No scleral icterus.  Upper and Lower EXTREMITY EXAM: Warm and well perfused Edema: No significant swelling or edema NEURO: unremarkable  MSK Exam: Right knee is overall well aligned.  He has pain across the medial joint line.  Mild pain with McMurray's.  Stable to varus and valgus strain.  Upper extremity exam is otherwise unremarkable with marked cervical rotational changes per procedure note.  Negative Spurling's and negative Lhermitte's compression test.  PROCEDURES & DATA REVIEWED:  PROCEDURE NOTE : OSTEOPATHIC MANIPULATION The decision today to treat with Osteopathic Manipulative Therapy (OMT) was based on physical exam findings. Verbal consent was obtained following a discussion with the patient regarding the of risks, benefits and potential side effects, including an acute pain flare,post manipulation soreness and need for repeat treatments. Additionally, we specifically discussed the minimal risk of  injury to neurovascular structures associated with Cervical manipulation.   Contraindications to OMT: NONE  Manipulation was performed as below: Regions treated: Cervical spine, Ribs and Thoracic spine OMT Techniques Used: HVLA, muscle energy and myofascial release  The patient tolerated the treatment well and reported Improved symptoms following treatment today. Patient was given medications, exercises, stretches and lifestyle modifications per AVS and verbally.   OSTEOPATHIC/STRUCTURAL EXAM:   OA - rotated right C2 FRS left (Flexed, Rotated & Sidebent) C5 ERS right (Extended, Rotated & Sidebent) T2 FRS left (Flexed, Rotated & Sidebent) Rib 8 Right  Posterior   ASSESSMENT   1. Chronic pain of right knee   2. Somatic dysfunction of cervical region   3. Somatic dysfunction of thoracic region   4. Somatic dysfunction of rib cage region     PLAN:  Osteopathic manipulation was performed today based on  physical exam findings.  Please see procedure note for further information including Osteopathic Exam findings  We will go ahead and inject the knee per procedure note and have them begin on hip and knee strenghtening exersises. Additionally we discussed the merits of compression and/or bracing and recommend prophylatic compression with activity.  Icing discussed PRN.  If persistent ongoing symptoms can consider repeat injections and viscous supplementation.   No problem-specific Assessment & Plan notes found for this encounter.  Orders Placed This Encounter  Procedures  . [place] MSK POCT ULTRASOUND  No orders of the defined types were placed in this encounter.    Follow-up: Return in about 2 weeks (around 04/23/2018).      Please see additional documentation for Objective, Assessment and Plan sections. Pertinent additional documentation may be included in corresponding procedure notes, imaging studies, problem based documentation and patient instructions. Please see these sections of the encounter for additional information regarding this visit.  CMA/ATC served as Education administrator during this visit. History, Physical, and Plan performed by medical provider. Documentation and orders reviewed and attested to.      Gerda Diss, [place] [facility] Physician

## 2018-04-09 NOTE — Patient Instructions (Signed)

## 2018-04-09 NOTE — Procedures (Signed)
PROCEDURE NOTE:  Ultrasound Guided: Injection: Right knee Images were obtained and interpreted by myself, Teresa Coombs, DO  Images have been saved and stored to PACS system. Images obtained on: GE S7 Ultrasound machine    ULTRASOUND FINDINGS:  Moderate degenerative meniscal bulging.  DESCRIPTION OF PROCEDURE:  The patient's clinical condition is marked by substantial pain and/or significant functional disability. Other conservative therapy has not provided relief, is contraindicated, or not appropriate. There is a reasonable likelihood that injection will significantly improve the patient's pain and/or functional impairment.   After discussing the risks, benefits and expected outcomes of the injection and all questions were reviewed and answered, the patient wished to undergo the above named procedure.  Verbal consent was obtained.  The ultrasound was used to identify the target structure and adjacent neurovascular structures. The skin was then prepped in sterile fashion and the target structure was injected under direct visualization using sterile technique as below:  Single injection performed as below: PREP: Alcohol and Ethel Chloride APPROACH:superiolateral, single injection, 25g 1.5 in. INJECTATE: 2 cc 0.5% Marcaine and 2 cc 40mg /mL DepoMedrol ASPIRATE: None DRESSING: Band-Aid  Post procedural instructions including recommending icing and warning signs for infection were reviewed.    This procedure was well tolerated and there were no complications.   IMPRESSION: Succesful Ultrasound Guided: Injection

## 2018-04-23 ENCOUNTER — Encounter: Payer: Self-pay | Admitting: Sports Medicine

## 2018-04-23 ENCOUNTER — Ambulatory Visit: Payer: 59 | Admitting: Sports Medicine

## 2018-04-23 VITALS — BP 116/80 | HR 75 | Ht 69.0 in | Wt 225.4 lb

## 2018-04-23 DIAGNOSIS — M9901 Segmental and somatic dysfunction of cervical region: Secondary | ICD-10-CM

## 2018-04-23 DIAGNOSIS — M25561 Pain in right knee: Secondary | ICD-10-CM | POA: Diagnosis not present

## 2018-04-23 DIAGNOSIS — M9908 Segmental and somatic dysfunction of rib cage: Secondary | ICD-10-CM

## 2018-04-23 DIAGNOSIS — G8929 Other chronic pain: Secondary | ICD-10-CM

## 2018-04-23 DIAGNOSIS — M542 Cervicalgia: Secondary | ICD-10-CM

## 2018-04-23 DIAGNOSIS — M9902 Segmental and somatic dysfunction of thoracic region: Secondary | ICD-10-CM

## 2018-04-23 MED ORDER — DICLOFENAC SODIUM 2 % TD SOLN: 1.0000 "application " | Freq: Two times a day (BID) | TRANSDERMAL | 0 refills | Status: AC

## 2018-04-23 MED ORDER — DICLOFENAC SODIUM 2 % TD SOLN: 1.0000 "application " | Freq: Two times a day (BID) | TRANSDERMAL | 2 refills | Status: DC

## 2018-04-23 MED ORDER — CYCLOBENZAPRINE HCL 10 MG PO TABS: 10.0000 mg | ORAL_TABLET | Freq: Three times a day (TID) | ORAL | 1 refills | Status: DC | PRN

## 2018-04-23 NOTE — Progress Notes (Signed)
PROCEDURE NOTE : OSTEOPATHIC MANIPULATION The decision today to treat with Osteopathic Manipulative Therapy (OMT) was based on physical exam findings. Verbal consent was obtained following a discussion with the patient regarding the of risks, benefits and potential side effects, including an acute pain flare,post manipulation soreness and need for repeat treatments.     Contraindications to OMT: NONE  Manipulation was performed as below: Regions treated: Cervical spine, Thoracic spine and Ribs OMT Techniques Used: HVLA, muscle energy and myofascial release  The patient tolerated the treatment well and reported Improved symptoms following treatment today. Patient was given medications, exercises, stretches and lifestyle modifications per AVS and verbally.   OSTEOPATHIC/STRUCTURAL EXAM:   OA - rotated right C2 Rotated right, side bent left C6 ERS left (Extended, Rotated & Sidebent) C7 ERS right (Extended, Rotated & Sidebent) T4 -6 Neutral, Rotated LEFT, Sidebent RIGHT Rib 8 Right  Posterior

## 2018-04-23 NOTE — Patient Instructions (Signed)
[facility] instructions for Duexis, Pennsaid and Vimovo:  Your prescription will be filled through a mail order pharmacy.  It is typically [facility] but may vary depending on where you live.  You will receive a phone call from them which will typically come from a [phone] phone number.  You must speak directly to them to have this medication filled.  When the pharmacy calls, they will need your mailing address (for overnight shipment of the medication) andy they will need payment information if you have a copay (typically no more than $10). If you have not heard from them 2-3 days after your appointment with Dr. Rigby, contact us at the office ([phone]) or through MyChart so we can reach back out to the pharmacy.    Pennsaid instructions: You have been given a sample/prescription for Pennsaid, a topical medication.     You are to apply this gel to your injured body part twice daily (morning and evening).   A little goes a long way so you can use about a pea-sized amount for each area.   Spread this small amount over the area into a thin film and let it dry.   Be sure that you do not rub the gel into your skin for more than 10 or 15 seconds otherwise it can irritate you skin.    Once you apply the gel, please do not put any other lotion or clothing in contact with that area for 30 minutes to allow the gel to absorb into your skin.   Some people are sensitive to the medication and can develop a sunburn-like rash.  If you have only mild symptoms it is okay to continue to use the medication but if you have any breakdown of your skin you should discontinue its use and please let us know.   If you have been written a prescription for Pennsaid, you will receive a pump bottle of this topical gel through a mail order pharmacy.  The instructions on the bottle will say to apply two pumps twice a day which may be too much gel for your particular area so use the pea-sized amount as your  guide.  

## 2018-04-23 NOTE — Progress Notes (Signed)
Gary Knight. Gary Knight, Gary Knight  Gary Knight - 47 y.o. male MRN [id number]  Date of birth: 26-Dec-1970  Visit Date: 04/23/2018  PCP: Susy Frizzle, MD   Referred by: Susy Frizzle, MD  Scribe(s) for today's visit: Wendy Poet, LAT, ATC  SUBJECTIVE:  Gary Knight" is here for Follow-up (R knee and neck pain) .    His neck pain symptoms INITIALLY: Began about 3-2 months ago and MOI is unknown.  Described as mild-moderate dull aching, radiating to his back and R leg. He also c/o R-sided LBP. Pain is the R leg is moderate and is worse with walking.  Worsened with walking, jarring, turning head side-to-side.  Improved with rest.  Additional associated symptoms include: He has noticed a lot of popping in his neck when he looks to the L.He c/o lump on the L side of his neck which developed after hitting his head on his car. He denies loss of control on bladder or bowel functions. He denies visual changes. He reports occasional dizziness but denies HA.     At this time symptoms are worsening compared to onset, he hit his head on his car last Thursday and heard a loud pop. Pain and popping has gotten worse since then.  He has been seeing a massage therapist and they advised him that he has inflammation down his spine and they didn't feel comfortable working on him until he was seen by medical provider. He has not tried any medications for pain.   He also c/o R knee pain Pain began several months ago but worsened over the weekend when he turned wrong getting in his kayak. Pain is moderate-severe aching, pulling. Pain does not radiate. Worse with walking, crossing legs, flexion.  Improves with rest Additional sx: He reports swelling about the R knee. He denies popping, clicking. He also reports some weakness in the R knee.   He has not tried any medications or other modalities for knee pain.    04/09/2018: Neck pain- Compared to the last office visit on 03/26/18, his previously described neck pain symptoms show no change.  He states that his neck con't to pop and crack.  He reports having a fairly constant HA in his L frontal/temporal lobe area. Current symptoms are moderate dull, aching pain & are nonradiating He has been taking Advil or Aleve prn.  He was prescribed a steroid dose pack at his last visit which he has completed.  R knee pain- Compared to the last office visit, his previously described R knee pain symptoms show no change Current symptoms are mod-severe aching and pulling & are nonradiating He has been taking Advil or Aleve prn.  He was prescribed a steroid dose pack at his last visit which he has completed.   04/23/2018: Neck pain Compared to the last office visit on 04/09/18, his previously described neck pain symptoms show no change.  He states that his neck is still popping and clicking Current symptoms are mild-mod dull, aching pain & are nonradiating He has been taking Advil or Aleve prn.  He had OMT at his last visit which helped w/ his HAs.  He was given the links to the [employer].  R knee pain: Compared to the last office visit on 04/09/18, his previously described R knee pain symptoms are improving w/ an approximate 60% improvement. He states that he has recently injured his L knee when a  rock stuck into the front of his L knee while he was inspecting a crawlspace. Current symptoms are moderate, burning pain when he transitions from a fully flexed position to an extended position & are nonradiating He has been taking Advil or Aleve prn.  He had a depomedrol injection at his last visit.   REVIEW OF SYSTEMS: Reports night time disturbances. Denies fevers, chills, or night sweats. Denies unexplained weight loss. Denies personal history of cancer. Denies changes in bowel or bladder habits. Denies recent unreported falls. Reports new or worsening  dyspnea or wheezing. Reports headaches or dizziness.  Denies numbness, tingling or weakness  In the extremities.  Denies dizziness or presyncopal episodes Denies lower extremity edema     HISTORY & PERTINENT PRIOR DATA:  Significant/pertinent history, findings, studies include:  reports that he has never smoked. He has never used smokeless tobacco. No results for input(s): HGBA1C, LABURIC, CREATINE in the last 8760 hours. No specialty comments available. No problems updated.  Otherwise prior history reviewed and updated per electronic medical record.    OBJECTIVE:  VS:  HT:5\' 9"  (175.3 cm)   WT:225 lb 6.4 oz (102.2 kg)  BMI:33.27    BP:116/80  HR:75bpm  TEMP: ( )  RESP:95 %   PHYSICAL EXAM: CONSTITUTIONAL: Well-developed, Well-nourished and In no acute distress Alert & appropriately interactive. and Not depressed or anxious appearing. RESPIRATORY: No increased work of breathing and Trachea Midline EYES: Pupils are equal., EOM intact without nystagmus. and No scleral icterus.  Upper and Lower extremities: Warm and well perfused Edema: No Pre-tibial edema and No significant swelling or edema NEURO: unremarkable Normal associated myotomal distribution strength to manual muscle testing Normal sensation to light touch  MSK Exam: Neck:: . Well aligned, no significant deformity. . No overlying skin changes. . No focal bony tenderness . TTP over Bilateral paraspinal muscles right worse than left. .  . Normal, non-painful: Spurling's compression test, Lhermitte's compression, arm squeeze test   PROCEDURES & DATA REVIEWED:  . Osteopathic manipulation was performed today based on physical exam findings.  Please see procedure note for further information including Osteopathic Exam findings  ASSESSMENT   1. Chronic pain of right knee   2. Somatic dysfunction of cervical region   3. Somatic dysfunction of thoracic region   4. Somatic dysfunction of rib cage region   5.  Neck pain     PLAN:   prescription for muscle relaxant prescription for NSAID given    . Prescription for Pennsaid to be used.  Muscle relaxant use intermittently. . If any lack of improvement consider further diagnostic evaluation with Further diagnostic evaluation of the knee and/or cervical spine with advanced imaging. No problem-specific Assessment & Plan notes found for this encounter.   Follow-up: Return in about 4 weeks (around 05/21/2018) for consideration of repeat Osteopathic Manipulation.      Please see additional documentation for Objective, Assessment and Plan sections. Pertinent additional documentation may be included in corresponding procedure notes, imaging studies, problem based documentation and patient instructions. Please see these sections of the encounter for additional information regarding this visit.  CMA/ATC served as Education administrator during this visit. History, Physical, and Plan performed by medical provider. Documentation and orders reviewed and attested to.      Gerda Diss, [facility] Physician

## 2018-04-27 ENCOUNTER — Encounter: Payer: Self-pay | Admitting: Sports Medicine

## 2018-05-01 ENCOUNTER — Encounter: Payer: Self-pay | Admitting: Sports Medicine

## 2018-05-02 ENCOUNTER — Other Ambulatory Visit: Payer: Self-pay

## 2018-05-02 MED ORDER — DICLOFENAC SODIUM 2 % TD SOLN: 1.0000 "application " | Freq: Two times a day (BID) | TRANSDERMAL | 2 refills | Status: DC

## 2018-05-12 ENCOUNTER — Encounter: Payer: Self-pay | Admitting: Sports Medicine

## 2018-05-21 ENCOUNTER — Ambulatory Visit: Payer: 59 | Admitting: Sports Medicine

## 2018-05-21 ENCOUNTER — Encounter: Payer: Self-pay | Admitting: Sports Medicine

## 2018-05-21 VITALS — BP 120/82 | HR 76 | Ht 69.0 in | Wt 221.6 lb

## 2018-05-21 DIAGNOSIS — G8929 Other chronic pain: Secondary | ICD-10-CM | POA: Diagnosis not present

## 2018-05-21 DIAGNOSIS — M9901 Segmental and somatic dysfunction of cervical region: Secondary | ICD-10-CM | POA: Diagnosis not present

## 2018-05-21 DIAGNOSIS — M9902 Segmental and somatic dysfunction of thoracic region: Secondary | ICD-10-CM | POA: Diagnosis not present

## 2018-05-21 DIAGNOSIS — M9908 Segmental and somatic dysfunction of rib cage: Secondary | ICD-10-CM

## 2018-05-21 DIAGNOSIS — M542 Cervicalgia: Secondary | ICD-10-CM

## 2018-05-21 DIAGNOSIS — M5441 Lumbago with sciatica, right side: Secondary | ICD-10-CM

## 2018-05-21 DIAGNOSIS — M25561 Pain in right knee: Secondary | ICD-10-CM | POA: Diagnosis not present

## 2018-05-21 NOTE — Progress Notes (Signed)
Gary Knight. Gary Knight, [facility]  Gary Knight - 47 y.o. male MRN [id number]  Date of birth: March 30, 1971  Visit Date: 05/21/2018  PCP: Susy Frizzle, MD   Referred by: Susy Frizzle, MD  Scribe(s) for today's visit: Josepha Pigg, CMA  SUBJECTIVE:  Gary Begin "[name]" is here for Follow-up (R knee pain, neck pain) .    HPI: His neck pain symptoms INITIALLY: Began about 3-2 months ago and MOI is unknown.  Described as mild-moderate dull aching, radiating to his back and R leg. He also c/o R-sided LBP. Pain is the R leg is moderate and is worse with walking.  Worsened with walking, jarring, turning head side-to-side.  Improved with rest.  Additional associated symptoms include: He has noticed a lot of popping in his neck when he looks to the L.He c/o lump on the L side of his neck which developed after hitting his head on his car. He denies loss of control on bladder or bowel functions. He denies visual changes. He reports occasional dizziness but denies HA.    At this time symptoms are worsening compared to onset, he hit his head on his car last Thursday and heard a loud pop. Pain and popping has gotten worse since then.  He has been seeing a massage therapist and they advised him that he has inflammation down his spine and they didn't feel comfortable working on him until he was seen by medical provider. He has not tried any medications for pain.   He also c/o R knee pain Pain began several months ago but worsened over the weekend when he turned wrong getting in his kayak. Pain is moderate-severe aching, pulling. Pain does not radiate. Worse with walking, crossing legs, flexion.  Improves with rest Additional sx: He reports swelling about the R knee. He denies popping, clicking. He also reports some weakness in the R knee.  He has not tried any medications or other modalities for knee pain.    04/09/2018: Neck pain- Compared to the last office visit on 03/26/18, his previously described neck pain symptoms show no change.  He states that his neck con't to pop and crack.  He reports having a fairly constant HA in his L frontal/temporal lobe area. Current symptoms are moderate dull, aching pain & are nonradiating He has been taking Advil or Aleve prn.  He was prescribed a steroid dose pack at his last visit which he has completed.  R knee pain- Compared to the last office visit, his previously described R knee pain symptoms show no change Current symptoms are mod-severe aching and pulling & are nonradiating He has been taking Advil or Aleve prn.  He was prescribed a steroid dose pack at his last visit which he has completed.   04/23/2018: Neck pain Compared to the last office visit on 04/09/18, his previously described neck pain symptoms show no change.  He states that his neck is still popping and clicking Current symptoms are mild-mod dull, aching pain & are nonradiating He has been taking Advil or Aleve prn.  He had OMT at his last visit which helped w/ his HAs.  He was given the links to the [employer].  R knee pain: Compared to the last office visit on 04/09/18, his previously described R knee pain symptoms are improving w/ an approximate 60% improvement. He states that he has recently injured his L knee when a rock stuck  into the front of his L knee while he was inspecting a crawlspace. Current symptoms are moderate, burning pain when he transitions from a fully flexed position to an extended position & are nonradiating He has been taking Advil or Aleve prn.  He had a depomedrol injection at his last visit.  05/21/2018: Neck pain Compared to the last office visit, his previously described symptoms are improving. He still has a lot of popping in his neck. Current symptoms are mild & are non-radiating He has been taking Flexeril but stopped because it was causing  constipation. 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 He received OMT at his last visit and tolerated well.   R knee pain Compared to the last office visit, his previously described symptoms are improving. Current symptoms are mild & are non-radiating. He has noticed mild swelling in the knee.  He has been using Pennsaid with good relief. He takes Advil/Aleve prn with some relief. He has been doing HEP with no trouble.     REVIEW OF SYSTEMS: Reports night time disturbances. Denies fevers, chills, or night sweats. Denies unexplained weight loss. Denies personal history of cancer. Denies changes in bowel or bladder habits. Denies recent unreported falls. Denies new or worsening dyspnea or wheezing. Reports headaches or dizziness.  Denies numbness, tingling or weakness  In the extremities.  Denies dizziness or presyncopal episodes Reports lower extremity edema     HISTORY & PERTINENT PRIOR DATA:  Significant/pertinent history, findings, studies include:  reports that he has never smoked. He has never used smokeless tobacco. No results for input(s): HGBA1C, LABURIC, CREATINE in the last 8760 hours. No specialty comments available. No problems updated.  Otherwise prior history reviewed and updated per electronic medical record.    OBJECTIVE:  VS:  HT:5\' 9"  (175.3 cm)   WT:221 lb 9.6 oz (100.5 kg)  BMI:32.71    BP:120/82  HR:76bpm  TEMP: ( )  RESP:95 %   PHYSICAL EXAM: CONSTITUTIONAL: Well-developed, Well-nourished and In no acute distress Psychiatric: Alert & appropriately interactive. and Not depressed or anxious appearing. RESPIRATORY: No increased work of breathing and Trachea Midline EYES: Pupils are equal., EOM intact without nystagmus. and No scleral icterus.  Upper and Lower extremities: EXTREMITY EXAM: Warm and well perfused Edema: No Pre-tibial edema and No significant swelling or edema NEURO: unremarkable Normal associated myotomal distribution  strength to manual muscle testing Normal sensation to light touch  MSK Exam: Neck:: . Well aligned, no significant deformity. . No overlying skin changes. . No focal bony tenderness . TTP over Bilateral paraspinal muscles right worse than left. .  . Normal, non-painful: Spurling's compression test, Lhermitte's compression, arm squeeze test   ASSESSMENT   1. Chronic pain of right knee   2. Somatic dysfunction of cervical region   3. Somatic dysfunction of thoracic region   4. Somatic dysfunction of rib cage region   5. Neck pain   6. Right-sided low back pain with right-sided sciatica, unspecified chronicity     PLAN:  Pertinent additional documentation may be included in corresponding procedure notes, imaging studies, problem based documentation and patient instructions.  Procedures:  . Osteopathic manipulation was performed today based on physical exam findings.  Please see procedure note for further information including Osteopathic Exam findings  Medications:  No orders of the defined types were placed in this encounter.  Discussion/Instructions: No problem-specific Assessment & Plan notes found for this encounter.  Marland Kitchen He is doing better.  This does seem to be more  functional in nature and has had good improvement in his day-to-day symptoms.. . Continue previously prescribed home exercise program.  . Discussed red flag symptoms that warrant earlier emergent evaluation and patient voices understanding. . Activity modifications and the importance of avoiding exacerbating activities (limiting pain to no more than a 4 / 10 during or following activity) recommended and discussed.  Follow-up:  . Return in about 6 weeks (around 07/02/2018) for consideration of repeat Osteopathic Manipulation.   . If any lack of improvement consider: further diagnostic evaluation with Advanced imaging  . At follow up will plan to consider: repeat osteopathic manipulation     CMA/ATC served  as scribe during this visit. History, Physical, and Plan performed by medical provider. Documentation and orders reviewed and attested to.      Gerda Diss, [facility] Physician

## 2018-06-09 ENCOUNTER — Encounter: Payer: Self-pay | Admitting: Sports Medicine

## 2018-06-09 NOTE — Progress Notes (Signed)
PROCEDURE NOTE : OSTEOPATHIC MANIPULATION The decision today to treat with Osteopathic Manipulative Therapy (OMT) was based on physical exam findings. Verbal consent was obtained following a discussion with the patient regarding the of risks, benefits and potential side effects, including an acute pain flare,post manipulation soreness and need for repeat treatments.     Contraindications to OMT: NONE  Manipulation was performed as below: Regions Treated OMT Techniques Used  Cervical spine Thoracic spine Ribs HVLA muscle energy myofascial release   The patient tolerated the treatment well and reported Improved symptoms following treatment today. Patient was given medications, exercises, stretches and lifestyle modifications per AVS and verbally.   OSTEOPATHIC/STRUCTURAL EXAM:   OA - rotated right C2 Rotated right, side bent left C6 ERS left (Extended, Rotated & Sidebent) C7 ERS right (Extended, Rotated & Sidebent) T4 -6 Neutral, Rotated LEFT, Sidebent RIGHT Rib 8 Right  Posterior

## 2018-06-23 ENCOUNTER — Encounter: Payer: Self-pay | Admitting: Sports Medicine

## 2018-06-23 ENCOUNTER — Ambulatory Visit: Payer: 59 | Admitting: Sports Medicine

## 2018-06-23 VITALS — BP 112/78 | HR 64 | Ht 69.0 in | Wt 225.6 lb

## 2018-06-23 DIAGNOSIS — M4726 Other spondylosis with radiculopathy, lumbar region: Secondary | ICD-10-CM

## 2018-06-23 DIAGNOSIS — M5441 Lumbago with sciatica, right side: Secondary | ICD-10-CM | POA: Insufficient documentation

## 2018-06-23 DIAGNOSIS — M5410 Radiculopathy, site unspecified: Secondary | ICD-10-CM | POA: Diagnosis not present

## 2018-06-23 DIAGNOSIS — M5442 Lumbago with sciatica, left side: Secondary | ICD-10-CM | POA: Diagnosis not present

## 2018-06-23 DIAGNOSIS — M479 Spondylosis, unspecified: Secondary | ICD-10-CM

## 2018-06-23 MED ORDER — GABAPENTIN 300 MG PO CAPS: ORAL_CAPSULE | ORAL | 1 refills | Status: DC

## 2018-06-23 MED ORDER — METHYLPREDNISOLONE ACETATE 80 MG/ML IJ SUSP
80.0000 mg | Freq: Once | INTRAMUSCULAR | Status: AC
  Administered 2018-06-23: 80 mg via INTRAMUSCULAR

## 2018-06-23 MED ORDER — KETOROLAC TROMETHAMINE 60 MG/2ML IM SOLN
60.0000 mg | Freq: Once | INTRAMUSCULAR | Status: AC
  Administered 2018-06-23: 60 mg via INTRAMUSCULAR

## 2018-06-23 MED ORDER — METHYLPREDNISOLONE 4 MG PO TBPK
ORAL_TABLET | ORAL | 0 refills | Status: DC
Start: 2018-06-23 — End: 2018-07-02

## 2018-06-23 NOTE — Patient Instructions (Signed)
I would like for you to begin on the Gabapentin (neurontin) just at night for the next week.  After 1 week you can increase the dose to one tablet in the morning and one tablet 1 hour before bed.  Over the next week, if you find you are having worsening pain around dinner time that is a sign that you are metabolizing the medication rapidly and would benefit from adding a mid afternoon dose (3 tablets daily = 1 tablet in the morning, 1 tablet around 3pm, 1 tablet 1 hour before bed).    The biggest side effect of Gabapentin is sleepiness hence why we start it at night.  Usually this sleepiness side-effect diminishes after a week and you are left with a good night sleep and improved control of your symptoms; if you are too sleepy please call and let us know so we can consider adjusting your dose.

## 2018-06-23 NOTE — Progress Notes (Signed)
Gary Knight. [place], [place] [facility]  Gary Knight - 47 y.o. male MRN [id number]  Date of birth: Dec 22, 1970  Visit Date: 06/23/2018  PCP: Gary Frizzle, MD   Referred by: Gary Frizzle, MD  Scribe(s) for today's visit: Wendy Poet, LAT, ATC  SUBJECTIVE:  Gary Knight is here for Follow-up (Low back pain)   His neck pain symptoms INITIALLY: Began about 3-2 months ago and MOI is unknown.  Described as mild-moderate dull aching, radiating to his back and R leg. He also c/o R-sided LBP. Pain is the R leg is moderate and is worse with walking.  Worsened with walking, jarring, turning head side-to-side.  Improved with rest.  Additional associated symptoms include: He has noticed a lot of popping in his neck when he looks to the L.He c/o lump on the L side of his neck which developed after hitting his head on his car. He denies loss of control on bladder or bowel functions. He denies visual changes. He reports occasional dizziness but denies HA.     At this time symptoms are worsening compared to onset, he hit his head on his car last Thursday and heard a loud pop. Pain and popping has gotten worse since then.  He has been seeing a massage therapist and they advised him that he has inflammation down his spine and they didn't feel comfortable working on him until he was seen by medical provider. He has not tried any medications for pain.   He also c/o R knee pain Pain began several months ago but worsened over the weekend when he turned wrong getting in his kayak. Pain is moderate-severe aching, pulling. Pain does not radiate. Worse with walking, crossing legs, flexion.  Improves with rest Additional sx: He reports swelling about the R knee. He denies popping, clicking. He also reports some weakness in the R knee.   He has not tried any medications or other modalities for knee pain.   06/23/2018: Compared to  the last office visit on 03/26/18, his previously described low back pain symptoms are worsening w/ LBP and B LE radicular pain (R>L).  Current episode started on Saturday after bending over to pick up a piece of trash off the floor.  He reports that the pain in his back is a constant aching, throbbing and sharp pain.  He is having difficulty w/ transtions from sit-to-stand.  He reports pain, N/T and weakness in B LEs. Current symptoms are severe & are radiating to B LEs (R>L) He has been using Pennsaid and has taken some leftover Flexeril.  He has also tried an Fre[employer].  He has also taken some prescription-strength Aleve and some Tylenol. L-spine and C-spine XR - 03/26/18  REVIEW OF SYSTEMS: Reports night time disturbances. Denies fevers, chills, or night sweats. Denies unexplained weight loss. Denies personal history of cancer. Denies changes in bowel or bladder habits. Denies recent unreported falls. Denies new or worsening dyspnea. Denies dizziness.  Reports weakness in B LEs and N/T. Denies dizziness or presyncopal episodes Denies lower extremity edema    HISTORY & PERTINENT PRIOR DATA:  Prior History reviewed and updated per electronic medical record.  Significant/pertinent history, findings, studies include:  reports that he has never smoked. He has never used smokeless tobacco. No results for input(s): HGBA1C, LABURIC, CREATINE in the last 8760 hours. No specialty comments available. No problems updated.  OBJECTIVE:  VS:  HT:5\' 9"  (175.3  cm)   WT:225 lb 9.6 oz (102.3 kg)  BMI:33.3    BP:112/78  HR:64bpm  TEMP: ( )  RESP:96 %   PHYSICAL EXAM: Constitutional: WDWN, Non-toxic appearing. Psychiatric: Alert & appropriately interactive.  Not depressed or anxious appearing. Respiratory: No increased work of breathing.  Trachea Midline Eyes: Pupils are equal.  EOM intact without nystagmus.  No scleral icterus  Vascular Exam: warm to touch no edema  Back: Patient is  quite uncomfortable and walks in the clinic bent over.  He has a hard time fully straightening up.  Markedly positive straight leg raise on the left greater than right.  He does have some pain with popliteal compression test.  Good well-preserved hip internal and external range of motion other than if tensioning of the hamstring. Lower extremity sensation has a generalized esthesia in the L5 and S1 dermatomes. Reflexes are intact.   ASSESSMENT   1. Acute bilateral low back pain with bilateral sciatica   2. Spondylosis   3. Radiculitis   4. Osteoarthritis of spine with radiculopathy, lumbar region      PROCEDURES:  None  PLAN:  Pertinent additional documentation may be included in corresponding procedure notes, imaging studies, problem based documentation and patient instructions.  No problem-specific Assessment & Plan notes found for this encounter.  Marked discomfort today with a acute left-sided radiculitis.  Given the significant flareup symptomatic treatment targeted at anti-inflammatory nerve stabilizing medications including gabapentinoids recommended.  Discussed appropriate use of both heat and ice with the patient today.   RICE (Rest, ICE, Compression, Elevation) principles reviewed with the patient.  Activity modifications and the importance of avoiding exacerbating activities (limiting pain to no more than a 4 / 10 during or following activity) recommended and discussed. Discussed red flag symptoms that warrant earlier emergent evaluation and patient voices understanding. Meds ordered this encounter  Medications  . DISCONTD: methylPREDNISolone (MEDROL DOSEPAK) 4 MG TBPK tablet    Sig: Take by mouth as directed. Take 6 tablets on the first day prescribed then as directed.    Dispense:  21 tablet    Refill:  0  . DISCONTD: gabapentin (NEURONTIN) 300 MG capsule    Sig: Start with 1 tab po qhs X 1 week, then increase to 1 tab po bid X 1 week then 1 tab po tid prn     Dispense:  90 capsule    Refill:  1  . methylPREDNISolone acetate (DEPO-MEDROL) injection 80 mg  . ketorolac (TORADOL) injection 60 mg   Lab Orders  No laboratory test(s) ordered today   Imaging Orders  No imaging studies ordered today   Referral Orders  No referral(s) requested today    If any lack of improvement will consider MRI of the lumbar spine Return for as scheduled.     CMA/ATC served as Education administrator during this visit. History, Physical, and Plan performed by medical provider. Documentation and orders reviewed and attested to.      Gerda Diss, [facility] Physician

## 2018-07-02 ENCOUNTER — Encounter: Payer: Self-pay | Admitting: Sports Medicine

## 2018-07-02 ENCOUNTER — Ambulatory Visit: Payer: 59 | Admitting: Sports Medicine

## 2018-07-02 VITALS — BP 120/86 | HR 78 | Ht 69.0 in | Wt 222.2 lb

## 2018-07-02 DIAGNOSIS — M5441 Lumbago with sciatica, right side: Secondary | ICD-10-CM

## 2018-07-02 DIAGNOSIS — M5442 Lumbago with sciatica, left side: Secondary | ICD-10-CM

## 2018-07-02 MED ORDER — GABAPENTIN 100 MG PO CAPS: 200.0000 mg | ORAL_CAPSULE | Freq: Three times a day (TID) | ORAL | 2 refills | Status: DC

## 2018-07-02 NOTE — Patient Instructions (Signed)

## 2018-07-02 NOTE — Progress Notes (Signed)
Gary Knight. [place], [place] [facility] [place]  Gary Knight - 47 y.o. male MRN [id number]  Date of birth: Nov 27, 1970  Visit Date: 07/02/2018  PCP: Susy Frizzle, MD   Referred by: Susy Frizzle, MD  Scribe(s) for today's visit: Wendy Poet, LAT, ATC  SUBJECTIVE:  Gary Knight is here for Follow-up (LBP and B LE radiculopathy)  His neck pain symptoms INITIALLY: Began about 3-2 months ago and MOI is unknown.  Described as mild-moderate dull aching, radiating to his back and R leg. He also c/o R-sided LBP. Pain is the R leg is moderate and is worse with walking.  Worsened with walking, jarring, turning head side-to-side.  Improved with rest.  Additional associated symptoms include: He has noticed a lot of popping in his neck when he looks to the L.He c/o lump on the L side of his neck which developed after hitting his head on his car. He denies loss of control on bladder or bowel functions. He denies visual changes. He reports occasional dizziness but denies HA.     At this time symptoms are worsening compared to onset, he hit his head on his car last Thursday and heard a loud pop. Pain and popping has gotten worse since then.  He has been seeing a massage therapist and they advised him that he has inflammation down his spine and they didn't feel comfortable working on him until he was seen by medical provider. He has not tried any medications for pain.   He also c/o R knee pain Pain began several months ago but worsened over the weekend when he turned wrong getting in his kayak. Pain is moderate-severe aching, pulling. Pain does not radiate. Worse with walking, crossing legs, flexion.  Improves with rest Additional sx: He reports swelling about the R knee. He denies popping, clicking. He also reports some weakness in the R knee.   He has not tried any medications or other modalities for knee pain.    06/23/2018: Compared to the last office visit on 03/26/18, his previously described low back pain symptoms are worsening w/ LBP and B LE radicular pain (R>L).  Current episode started on Saturday after bending over to pick up a piece of trash off the floor.  He reports that the pain in his back is a constant aching, throbbing and sharp pain.  He is having difficulty w/ transtions from sit-to-stand.  He reports pain, N/T and weakness in B LEs. Current symptoms are severe & are radiating to B LEs (R>L) He has been using Pennsaid and has taken some leftover Flexeril.  He has also tried an Freight forwarder.  He has also taken some prescription-strength Aleve and some Tylenol.  07/02/2018: Compared to the last office visit on 06/23/18, his previously described low back and B LE symptoms are improving and feels approximately 70% improved. Current symptoms are mild-moderate depending on activity & are radiating to B LEs (R>L).  He states that he gets radiating pain throughout the entire R LE but only has pain in the L LE from the knee to the foot.  He also notes N/T in the R LE He has been taking Gabapentin 300mg  and finished his steroid dose pack.  He also had a Toradol and Depo 80 IM injection. L-spine and C-spine XR - 03/26/18  REVIEW OF SYSTEMS: Reports night time disturbances. Denies fevers, chills, or night sweats. Denies unexplained weight loss. Denies personal history of  cancer. Denies changes in bowel or bladder habits. Denies recent unreported falls. Denies new or worsening dyspnea. Denies dizziness.  Reports weakness in B LEs and N/T in R LE. Denies dizziness or presyncopal episodes Denies lower extremity edema    HISTORY & PERTINENT PRIOR DATA:  Prior History reviewed and updated per electronic medical record.  Significant/pertinent history, findings, studies include:  reports that he has never smoked. He has never used smokeless tobacco. No results for input(s): HGBA1C, LABURIC,  CREATINE in the last 8760 hours. No specialty comments available. No problems updated.  OBJECTIVE:  VS:  HT:5\' 9"  (175.3 cm)   WT:222 lb 3.2 oz (100.8 kg)  BMI:32.8    BP:120/86  HR:78bpm  TEMP: ( )  RESP:96 %   PHYSICAL EXAM: Constitutional: WDWN, Non-toxic appearing. Psychiatric: Alert & appropriately interactive.  Not depressed or anxious appearing. Respiratory: No increased work of breathing.  Trachea Midline Eyes: Pupils are equal.  EOM intact without nystagmus.  No scleral icterus  Vascular Exam: warm to touch no edema  MSK Exam: BACK Exam: Normal alignment & Contours Skin: No overlying erythema/ecchymosis  MOTOR TESTING: Intact in all LE myotomes and Able to heel and toe walk without difficutly        RIGHT    LEFT Straight leg raise-------------------------: positive, moderate pain                         positive, mild pain Braggard Stretch Test------------------: positive, moderate pain                         positive, moderate pain Slump Sign--------------------------------: positive, mild pain                         positive, mild pain Popliteal compression test------------: positive, moderate pain                         normal, no pain  REFLEXES Right Left  DTR - L3/4 -Patellar 3+ 2+  DTR - L5/S1 - Achilles 3+ 2+     ASSESSMENT   1. Right-sided low back pain with right-sided sciatica, unspecified chronicity   2. Acute bilateral low back pain with bilateral sciatica      PROCEDURES:  None  PLAN:  Pertinent additional documentation may be included in corresponding procedure notes, imaging studies, problem based documentation and patient instructions.  No problem-specific Assessment & Plan notes found for this encounter.   Symptoms are consistent with acute herniation monitoring the past week.  He is able for time tolerating the full dose of gabapentin during the day we will give him a slightly modified lower dose.  Given the persistent  ongoing pain and slight weakness MR of the lumbar spine recommended at this time.  Continue previously prescribed home exercise program.   Activity modifications and the importance of avoiding exacerbating activities (limiting pain to no more than a 4 / 10 during or following activity) recommended and discussed.  Discussed red flag symptoms that warrant earlier emergent evaluation and patient voices understanding.   Meds ordered this encounter  Medications  . gabapentin (NEURONTIN) 100 MG capsule    Sig: Take 2 capsules (200 mg total) by mouth 3 (three) times daily.    Dispense:  180 capsule    Refill:  2   Lab Orders  No laboratory test(s) ordered today   Imaging Orders  MR Lumbar Spine Wo Contrast Referral Orders  No referral(s) requested today    Return for MRI results review in person.     CMA/ATC served as Education administrator during this visit. History, Physical, and Plan performed by medical provider. Documentation and orders reviewed and attested to.      Gerda Diss, [facility] Physician

## 2018-07-19 ENCOUNTER — Other Ambulatory Visit: Payer: 59

## 2018-09-12 ENCOUNTER — Encounter: Payer: Self-pay | Admitting: Sports Medicine

## 2018-09-21 ENCOUNTER — Encounter: Payer: Self-pay | Admitting: Sports Medicine

## 2018-10-08 DIAGNOSIS — C44519 Basal cell carcinoma of skin of other part of trunk: Secondary | ICD-10-CM | POA: Diagnosis not present

## 2018-10-08 DIAGNOSIS — L821 Other seborrheic keratosis: Secondary | ICD-10-CM | POA: Diagnosis not present

## 2018-10-08 DIAGNOSIS — D2262 Melanocytic nevi of left upper limb, including shoulder: Secondary | ICD-10-CM | POA: Diagnosis not present

## 2018-10-08 DIAGNOSIS — D2261 Melanocytic nevi of right upper limb, including shoulder: Secondary | ICD-10-CM | POA: Diagnosis not present

## 2018-10-08 DIAGNOSIS — L570 Actinic keratosis: Secondary | ICD-10-CM | POA: Diagnosis not present

## 2019-12-28 ENCOUNTER — Encounter: Payer: Self-pay | Admitting: Podiatry

## 2019-12-28 ENCOUNTER — Other Ambulatory Visit: Payer: Self-pay

## 2019-12-28 ENCOUNTER — Other Ambulatory Visit: Payer: Self-pay | Admitting: Podiatry

## 2019-12-28 ENCOUNTER — Ambulatory Visit (INDEPENDENT_AMBULATORY_CARE_PROVIDER_SITE_OTHER): Payer: 59

## 2019-12-28 ENCOUNTER — Ambulatory Visit: Payer: 59 | Admitting: Podiatry

## 2019-12-28 VITALS — Temp 97.1°F

## 2019-12-28 DIAGNOSIS — M722 Plantar fascial fibromatosis: Secondary | ICD-10-CM

## 2019-12-28 DIAGNOSIS — R52 Pain, unspecified: Secondary | ICD-10-CM | POA: Diagnosis not present

## 2019-12-28 DIAGNOSIS — L309 Dermatitis, unspecified: Secondary | ICD-10-CM

## 2019-12-28 MED ORDER — TERBINAFINE HCL 250 MG PO TABS: 250.0000 mg | ORAL_TABLET | Freq: Every day | ORAL | 0 refills | Status: DC

## 2019-12-30 NOTE — Progress Notes (Signed)
Subjective:   Patient ID: Gary Knight, male   DOB: 49 y.o.   MRN: [id number]   HPI Patient presents with long-term history fasciitis with irritated skin noted also bilateral.  States this is been ongoing has had previous orthotics which is done well but they are losing some of their shape and the top covers are starting to reduce and he needs to wear them all the time and he wants a new pair.  Patient does not smoke likes to be active   Review of Systems  All other systems reviewed and are negative.       Objective:  Physical Exam Vitals and nursing note reviewed.  Constitutional:      Appearance: He is well-developed.  Pulmonary:     Effort: Pulmonary effort is normal.  Musculoskeletal:        General: Normal range of motion.  Skin:    General: Skin is warm.  Neurological:     Mental Status: He is alert.     Neurovascular status intact muscle strength found to be adequate range of motion within normal limits.  Patient does have mild depression of the arch plantar discomfort localized in nature which is something that he is dealt with with orthotics which have been helpful along with sciatica that also orthotics help     Assessment:  History of fasciitis that responds well to elevation of the arch and reduced pressure     Plan:  H&P reviewed condition x-rays and at this point patient is casted for new functional orthotics and we discussed possible repadding of his old orthotics.  He will reappoint 3 weeks to be fitted by ped orthotist and is encouraged to call with questions concerns  X-rays indicate there is moderate depression there is no indications of advanced arthritis or any kind of coalition condition

## 2021-02-05 ENCOUNTER — Emergency Department (HOSPITAL_COMMUNITY): Payer: 59

## 2021-02-05 ENCOUNTER — Other Ambulatory Visit: Payer: Self-pay

## 2021-02-05 ENCOUNTER — Encounter (HOSPITAL_COMMUNITY): Payer: Self-pay | Admitting: *Deleted

## 2021-02-05 ENCOUNTER — Emergency Department (HOSPITAL_COMMUNITY)
Admission: EM | Admit: 2021-02-05 | Discharge: 2021-02-06 | Disposition: A | Discharge status: Home / Self Care | Payer: 59 | Attending: Emergency Medicine | Admitting: Emergency Medicine

## 2021-02-05 DIAGNOSIS — T1490XA Injury, unspecified, initial encounter: Secondary | ICD-10-CM | POA: Diagnosis not present

## 2021-02-05 DIAGNOSIS — M79672 Pain in left foot: Secondary | ICD-10-CM | POA: Insufficient documentation

## 2021-02-05 DIAGNOSIS — S0081XA Abrasion of other part of head, initial encounter: Secondary | ICD-10-CM | POA: Diagnosis not present

## 2021-02-05 DIAGNOSIS — S060X1A Concussion with loss of consciousness of 30 minutes or less, initial encounter: Secondary | ICD-10-CM | POA: Diagnosis not present

## 2021-02-05 DIAGNOSIS — T07XXXA Unspecified multiple injuries, initial encounter: Secondary | ICD-10-CM

## 2021-02-05 DIAGNOSIS — Z23 Encounter for immunization: Secondary | ICD-10-CM | POA: Insufficient documentation

## 2021-02-05 DIAGNOSIS — Y92410 Unspecified street and highway as the place of occurrence of the external cause: Secondary | ICD-10-CM | POA: Diagnosis not present

## 2021-02-05 DIAGNOSIS — Y9355 Activity, bike riding: Secondary | ICD-10-CM | POA: Insufficient documentation

## 2021-02-05 LAB — I-STAT CHEM 8, ED
BUN: 16 mg/dL (ref 6–20)
Calcium, Ion: 1.05 mmol/L — ABNORMAL LOW (ref 1.15–1.40)
Chloride: 106 mmol/L (ref 98–111)
Creatinine, Ser: 1.1 mg/dL (ref 0.61–1.24)
Glucose, Bld: 121 mg/dL — ABNORMAL HIGH (ref 70–99)
HCT: 39 % (ref 39.0–52.0)
Hemoglobin: 13.3 g/dL (ref 13.0–17.0)
Potassium: 3.6 mmol/L (ref 3.5–5.1)
Sodium: 140 mmol/L (ref 135–145)
TCO2: 20 mmol/L — ABNORMAL LOW (ref 22–32)

## 2021-02-05 MED ORDER — SODIUM CHLORIDE 0.9 % IV SOLN: Freq: Once | INTRAVENOUS | Status: AC

## 2021-02-05 MED ORDER — IOHEXOL 350 MG/ML SOLN
50.0000 mL | Freq: Once | INTRAVENOUS | Status: AC | PRN
  Administered 2021-02-05: 50 mL via INTRAVENOUS

## 2021-02-05 MED ORDER — TETANUS-DIPHTH-ACELL PERTUSSIS 5-2.5-18.5 LF-MCG/0.5 IM SUSY
0.5000 mL | PREFILLED_SYRINGE | Freq: Once | INTRAMUSCULAR | Status: AC
  Administered 2021-02-05: 0.5 mL via INTRAMUSCULAR
  Filled 2021-02-05: qty 0.5

## 2021-02-05 MED ORDER — METHOCARBAMOL 500 MG PO TABS: 500.0000 mg | ORAL_TABLET | Freq: Four times a day (QID) | ORAL | 0 refills | Status: DC | PRN

## 2021-02-05 MED ORDER — IBUPROFEN 600 MG PO TABS: 600.0000 mg | ORAL_TABLET | Freq: Four times a day (QID) | ORAL | 0 refills | Status: DC | PRN
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
## 2021-02-05 NOTE — ED Notes (Signed)
To CT at this time, no changes.
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
## 2021-02-05 NOTE — Discharge Instructions (Addendum)
1.  Schedule follow-up appointment with [facility] for concussion.  Follow instructions for concussion and discharge instructions. 2.  You may take extra strength Tylenol (don't mix Tylenol with Vicodin) and or ibuprofen for pain control.  Also been prescribed a muscle relaxer as you will probably get more stiffness and soreness over the next 3 to 5 days. 3.  Also make appointment to follow-up with your family doctor for monitoring wounds in other areas of pain. 4.  Return to the emergency department if you have new or worsening symptoms

## 2021-02-05 NOTE — ED Notes (Signed)
Patient transported to CT 
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
## 2021-02-06 ENCOUNTER — Encounter: Payer: Self-pay | Admitting: Podiatry

## 2021-02-06 ENCOUNTER — Emergency Department (HOSPITAL_COMMUNITY): Payer: 59

## 2021-02-06 LAB — SAMPLE TO BLOOD BANK

## 2021-02-06 MED ORDER — HYDROCODONE-ACETAMINOPHEN 5-325 MG PO TABS: 1.0000 | ORAL_TABLET | Freq: Four times a day (QID) | ORAL | 0 refills | Status: DC | PRN

## 2021-02-06 MED ORDER — ACETAMINOPHEN 500 MG PO TABS
1000.0000 mg | ORAL_TABLET | Freq: Four times a day (QID) | ORAL | Status: DC | PRN
  Administered 2021-02-06: 1000 mg via ORAL

## 2021-02-06 NOTE — ED Provider Notes (Signed)
5:07 AM MRI resulted.  Discussed results with Dr. Lorrin Goodell, who states patient is clear from neurology standpoint.   Patient seems improved throughout the night.  No repetitive questioning during my reassessment.    He has family that will care for him while he is recovering.    Post op shoe for toe fracture.  Follow-up as per Dr. Samson Frederic recommendations.   Montine Circle, PA-C 02/06/21 6468    Charlesetta Shanks, MD 02/07/21 2140

## 2021-02-06 NOTE — Progress Notes (Signed)
Orthopedic Tech Progress Note Patient Details:  Gary Knight August 23, 1971 [id number]  Ortho Devices Type of Ortho Device: Postop shoe/boot Ortho Device/Splint Location: lle Ortho Device/Splint Interventions: Ordered, Application, Adjustment   Post Interventions Patient Tolerated: Well Instructions Provided: Care of device, Adjustment of device  Karolee Stamps 02/06/2021, 5:26 AM

## 2021-02-06 NOTE — ED Notes (Signed)
Patient transported to MRI 

## 2021-02-08 ENCOUNTER — Encounter: Payer: Self-pay | Admitting: Neurology

## 2021-02-08 ENCOUNTER — Other Ambulatory Visit: Payer: Self-pay

## 2021-02-08 ENCOUNTER — Ambulatory Visit: Payer: 59 | Admitting: Neurology

## 2021-02-08 ENCOUNTER — Telehealth: Payer: Self-pay | Admitting: Neurology

## 2021-02-08 VITALS — BP 124/80 | HR 69 | Ht 70.0 in | Wt 224.0 lb

## 2021-02-08 DIAGNOSIS — R412 Retrograde amnesia: Secondary | ICD-10-CM

## 2021-02-08 DIAGNOSIS — S062X1A Diffuse traumatic brain injury with loss of consciousness of 30 minutes or less, initial encounter: Secondary | ICD-10-CM

## 2021-02-08 DIAGNOSIS — R278 Other lack of coordination: Secondary | ICD-10-CM

## 2021-02-08 DIAGNOSIS — R270 Ataxia, unspecified: Secondary | ICD-10-CM | POA: Diagnosis not present

## 2021-02-08 DIAGNOSIS — S069X1A Unspecified intracranial injury with loss of consciousness of 30 minutes or less, initial encounter: Secondary | ICD-10-CM | POA: Diagnosis not present

## 2021-02-08 MED ORDER — DONEPEZIL HCL 5 MG PO TABS: 5.0000 mg | ORAL_TABLET | Freq: Two times a day (BID) | ORAL | 3 refills | Status: DC

## 2021-02-08 MED ORDER — PREDNISONE 10 MG PO TABS: ORAL_TABLET | ORAL | 0 refills | Status: DC

## 2021-02-08 NOTE — Telephone Encounter (Signed)
MRI brain w/wo contrast [employer]: [place] via [facility] website  Sent to GI for scheduling
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
## 2021-02-08 NOTE — Patient Instructions (Addendum)
Component Ref Range & Units 3 d ago   Sodium 135 - 145 mmol/L 140   Potassium 3.5 - 5.1 mmol/L 3.6   Chloride 98 - 111 mmol/L 106   BUN 6 - 20 mg/dL 16   Comment: QA FLAGS AND/OR RANGES MODIFIED BY DEMOGRAPHIC UPDATE ON 06/12 AT 1843  Creatinine, Ser 0.61 - 1.24 mg/dL 1.10   Glucose, Bld 70 - 99 mg/dL 121 High    Comment: Glucose reference range applies only to samples taken after fasting for at least 8 hours.  Calcium, Ion 1.15 - 1.40 mmol/L 1.05 Low    TCO2 22 - 32 mmol/L 20 Low    Hemoglobin 13.0 - 17.0 g/dL 13.3   HCT 39.0 - 52.0 % 39.0   Resulting Agency  [facility]         Specimen Collected: 02/05/21 18:27 Last Resulted: 02/05/21 18:43      Lab Flowsheet    Order Details    View Encounter      Traumatic Brain Injury Traumatic brain injury (TBI) is an injury to the brain that results from: A hard, direct blow to the head (closed injury). An object penetrating the skull and entering the brain (open injury). Traumatic brain injury is also called a head injury or a concussion. TBI can bemild, moderate, or severe. What are the causes? Common causes of this condition include: Falls. Motor vehicle accidents. Sports injuries. Assaults. What increases the risk? You are more likely to develop this condition if you: Are 5 years old or older. Are a man. Play contact sports, especially football, hockey, or soccer. Are in the [employer]. Are a victim of violence. Abuse drugs or alcohol. Have had a previous TBI. What are the signs or symptoms? Symptoms may vary from person to person, and may include: Loss of consciousness. Headache. Confusion. Fatigue. Changes in sleep. Dizziness. Mood or personality changes. Memory problems. Nausea or vomiting or both. Seizures. Clumsiness. Slurred speech. Depression and anxiety. Anger. Trouble concentrating, organizing, or making decisions. Inability to control emotions or actions (impulse control). Loss of or dulling of the  senses, such as hearing, vision, and touch. This can include: Blurred vision. Ringing in your ears. How is this diagnosed? This condition may be diagnosed based on: Medical history and physical exam. Neurologic exam. This checks for brain and nervous system function, including your reflexes, memory, and coordination. CT scan. Your TBI may be described as mild, moderate, or severe. How is this treated? Treatment depends on the severity of your brain injury and may include: Breathing support (mechanical ventilation). Blood pressure medicines. Pain medicines. Treatments to decrease the swelling in your brain. Brain surgery. This may be needed to: Remove a blood clot. Repair bleeding. Remove an object that has penetrated the brain, such as a skull fragment or a bullet. Treatment of TBI also includes: Physical and mental rest. Careful observation. Medicine. You may be prescribed medicines to help with symptoms such as headaches, nausea, or difficulty sleeping. Physical, occupational, and speech therapy. Referral to a concussion clinic or rehabilitation center. Follow these instructions at home: Medicines Take over-the-counter and prescription medicines only as told by your health care provider. Do not take blood thinners (anticoagulants), aspirin, or other anti-inflammatory medicines such as ibuprofen or naproxen unless approved by your health care provider. Activity Rest. Rest helps the brain to heal. Make sure you: Get plenty of sleep. Most adults should get 7-9 hours of sleep each night. Rest during the day. Take daytime naps or rest breaks  when you feel tired. Do not do high-risk activities that could cause a second concussion, such as riding a bike or playing sports. Having another concussion before the first one has healed can be dangerous. Avoid a lot of visual stimulation. This includes work on the computer or phone, watching TV, and reading. Ask your health care provider what  kind of activities are safe for you. Your ability to react may be slower after a brain injury. Never do these activities if you are dizzy. Your health care provider will likely give you a plan for gradually returning to activities. General instructions Do not drink alcohol. Watch your symptoms and tell others to do the same. Complications sometimes occur after a brain injury. Older adults with a brain injury may have a higher risk of serious complications. Seek support from friends and family. Keep all follow-up visits as directed by your health care provider. This is important. Contact a health care provider if: Your symptoms get worse or they do not improve. You have new symptoms. You have another injury. Get help right away if: You have: Severe, persistent headaches that are not relieved by medicine. Weakness or numbness in any part of your body. Confusion. Slurred speech. Difficulty waking up. Nausea or persistent vomiting. A feeling like you are moving when you are not (vertigo). Seizures or you faint. Changes in your vision. Clear or bloody discharge from your nose or ears. You cannot use your arms or legs normally. Summary Traumatic brain injury happens when there is a hard, direct blow to the head or when an object penetrates the skull and enters the brain. Traumatic brain injuries may be mild, moderate, or severe. Treatment depends on the severity of your injury. Get help right away if you have a head injury and you develop seizures, confusion, vomiting, weakness in the arms or legs, slurred speech, and other symptoms. Rest is one of the best treatments. Do not return to activity until your health care provider approves. This information is not intended to replace advice given to you by your health care provider. Make sure you discuss any questions you have with your healthcare provider. Document Revised: 12/18/2017 Document Reviewed: 09/30/2017 Elsevier Patient Education   [place].

## 2021-02-10 ENCOUNTER — Ambulatory Visit: Payer: Self-pay

## 2021-02-10 ENCOUNTER — Ambulatory Visit (INDEPENDENT_AMBULATORY_CARE_PROVIDER_SITE_OTHER): Payer: 59 | Admitting: Family Medicine

## 2021-02-10 ENCOUNTER — Other Ambulatory Visit: Payer: Self-pay

## 2021-02-10 ENCOUNTER — Encounter: Payer: Self-pay | Admitting: Family Medicine

## 2021-02-10 DIAGNOSIS — M25512 Pain in left shoulder: Secondary | ICD-10-CM

## 2021-02-10 DIAGNOSIS — M25572 Pain in left ankle and joints of left foot: Secondary | ICD-10-CM | POA: Diagnosis not present

## 2021-02-10 DIAGNOSIS — M25562 Pain in left knee: Secondary | ICD-10-CM

## 2021-02-10 MED ORDER — HYDROCODONE-ACETAMINOPHEN 5-325 MG PO TABS: 1.0000 | ORAL_TABLET | Freq: Four times a day (QID) | ORAL | 0 refills | Status: DC | PRN
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
## 2021-02-13 ENCOUNTER — Ambulatory Visit: Payer: 59 | Admitting: Neurology

## 2021-02-13 DIAGNOSIS — S062X1A Diffuse traumatic brain injury with loss of consciousness of 30 minutes or less, initial encounter: Secondary | ICD-10-CM

## 2021-02-13 DIAGNOSIS — R410 Disorientation, unspecified: Secondary | ICD-10-CM | POA: Diagnosis not present

## 2021-02-13 DIAGNOSIS — S069X1A Unspecified intracranial injury with loss of consciousness of 30 minutes or less, initial encounter: Secondary | ICD-10-CM

## 2021-02-13 DIAGNOSIS — R278 Other lack of coordination: Secondary | ICD-10-CM

## 2021-02-13 DIAGNOSIS — R412 Retrograde amnesia: Secondary | ICD-10-CM

## 2021-02-13 DIAGNOSIS — R270 Ataxia, unspecified: Secondary | ICD-10-CM

## 2021-02-15 ENCOUNTER — Telehealth: Payer: Self-pay | Admitting: Neurology

## 2021-02-15 NOTE — Telephone Encounter (Signed)
Pt's wife, Johathan Province (on [place]) called, checking on when he will start physical therapy. Have not heard from anyone. Would like a call from the nurse.

## 2021-02-15 NOTE — Telephone Encounter (Signed)
Pt's wife called back and the message from [facility] was read to her. The telephone # to rehab was provided as well.  This is FYI no call back requested.

## 2021-02-15 NOTE — Telephone Encounter (Signed)
Called the wife back. There was no answer. Asked for a call back. (DPR not scanned in yet)  ** If wife returns call and DPR is verified, may tell her that his apt was 02/08/2021 and it can take a couple weeks for the referral to go through. The rehab area should be contacting the patient once they receive to get him scheduled. If they don't hear anything in a couple of weeks they can contact the neuro rehab area to check on status of getting an apt scheduled. Their number is [phone].

## 2021-02-16 ENCOUNTER — Telehealth: Payer: Self-pay | Admitting: Neurology

## 2021-02-16 NOTE — Telephone Encounter (Signed)
Myriam Jacobson can you call the patients wife to discuss his EEG results and if you can't can a NP review with her. She also dropped off FMLA paperwork but [employer] is out for 2 weeks and he needs back within 15 days of the dated document 6/15. She is asking is there someone else that can complete the paperwork in her absence? Thank you

## 2021-02-19 ENCOUNTER — Other Ambulatory Visit: Payer: Self-pay

## 2021-02-19 ENCOUNTER — Ambulatory Visit
Admission: RE | Admit: 2021-02-19 | Discharge: 2021-02-19 | Disposition: A | Discharge status: Home / Self Care | Payer: 59 | Source: Ambulatory Visit | Attending: Neurology | Admitting: Neurology

## 2021-02-19 DIAGNOSIS — R270 Ataxia, unspecified: Secondary | ICD-10-CM

## 2021-02-19 MED ORDER — GADOBENATE DIMEGLUMINE 529 MG/ML IV SOLN
20.0000 mL | Freq: Once | INTRAVENOUS | Status: AC | PRN
  Administered 2021-02-19: 20 mL via INTRAVENOUS

## 2021-02-20 ENCOUNTER — Encounter: Payer: Self-pay | Admitting: Neurology

## 2021-02-20 ENCOUNTER — Telehealth: Payer: Self-pay

## 2021-02-20 NOTE — Telephone Encounter (Signed)
See other mychart message.

## 2021-02-20 NOTE — Telephone Encounter (Signed)
Pt's wife(on DPR) is asking for a call to discuss the results to pt's EEG and MRI.  Wife told the the response time  for results to EEG's is about 10 days and re: the MRI that pt just had over the weekend , she will have to allow sufficient response time for those results as well.  Wife states pt is ready to return to work as to why they are anxious for the results.

## 2021-02-20 NOTE — Telephone Encounter (Signed)
Dr. Felecia Shelling- do you have access to these results?

## 2021-02-22 ENCOUNTER — Telehealth: Payer: Self-pay | Admitting: Neurology

## 2021-02-22 DIAGNOSIS — S062X9A Diffuse traumatic brain injury with loss of consciousness of unspecified duration, initial encounter: Secondary | ICD-10-CM | POA: Insufficient documentation

## 2021-02-22 DIAGNOSIS — S069XAA Unspecified intracranial injury with loss of consciousness status unknown, initial encounter: Secondary | ICD-10-CM | POA: Insufficient documentation

## 2021-02-22 DIAGNOSIS — S062XAA Diffuse traumatic brain injury with loss of consciousness status unknown, initial encounter: Secondary | ICD-10-CM | POA: Insufficient documentation

## 2021-02-22 DIAGNOSIS — S069X9A Unspecified intracranial injury with loss of consciousness of unspecified duration, initial encounter: Secondary | ICD-10-CM | POA: Insufficient documentation

## 2021-02-22 NOTE — Progress Notes (Signed)
Normal EEG , MRI brain repeat was normal, not showing edema, bleed or abnormal contrast enhancement. .  Neuro rehab was ordered, our RV will address Memory testing.  The patient should feel physically and mentally back to baseline function before returning to work and to driving.  I have no reason to keep him from returning to work if he has reached this stage of recovery. CD
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
## 2021-02-22 NOTE — Telephone Encounter (Signed)
Called the patient to review the EEG results. Advised the pt per Dr Brett Fairy he is cleared to return to work. Pt needs a letter stating that. Advised a letter would be written and on mychart. Wife asked if could be faxed to her as well at [phone]. Their card was processed for [facility] paperwork, but we are not required to complete the paperwork and they are asking for that to be credited. Advised I would let the billing dept know. She was appreciative

## 2021-02-22 NOTE — Telephone Encounter (Signed)
Error

## 2021-02-22 NOTE — Telephone Encounter (Signed)
-----   Message from Larey Seat, MD sent at 02/22/2021  4:13 PM EDT ----- Normal EEG , MRI brain repeat was normal, not showing edema, bleed or abnormal contrast enhancement. .  Neuro rehab was ordered, our RV will address Memory testing.  The patient should feel physically and mentally back to baseline function before returning to work and to driving.  I have no reason to keep him from returning to work if he has reached this stage of recovery. CD

## 2021-02-22 NOTE — Telephone Encounter (Signed)
Normal EEG and brain MRI- if he feels fit enough to return to work he is free to go. CD

## 2021-02-26 ENCOUNTER — Other Ambulatory Visit: Payer: Self-pay | Admitting: Neurology

## 2021-03-03 ENCOUNTER — Ambulatory Visit (INDEPENDENT_AMBULATORY_CARE_PROVIDER_SITE_OTHER): Payer: 59 | Admitting: Family Medicine

## 2021-03-03 ENCOUNTER — Other Ambulatory Visit: Payer: Self-pay

## 2021-03-03 ENCOUNTER — Encounter: Payer: Self-pay | Admitting: Family Medicine

## 2021-03-03 DIAGNOSIS — M25562 Pain in left knee: Secondary | ICD-10-CM

## 2021-03-03 DIAGNOSIS — M542 Cervicalgia: Secondary | ICD-10-CM

## 2021-03-03 DIAGNOSIS — M25512 Pain in left shoulder: Secondary | ICD-10-CM | POA: Diagnosis not present

## 2021-03-03 DIAGNOSIS — M25572 Pain in left ankle and joints of left foot: Secondary | ICD-10-CM

## 2021-03-03 MED ORDER — MELOXICAM 15 MG PO TABS: 7.5000 mg | ORAL_TABLET | Freq: Every day | ORAL | 6 refills | Status: DC | PRN

## 2021-03-03 MED ORDER — CYCLOBENZAPRINE HCL 10 MG PO TABS: 10.0000 mg | ORAL_TABLET | Freq: Three times a day (TID) | ORAL | 3 refills | Status: DC | PRN
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
## 2021-03-04 ENCOUNTER — Other Ambulatory Visit: Payer: Self-pay | Admitting: Neurology

## 2021-03-21 ENCOUNTER — Ambulatory Visit: Payer: 59 | Admitting: Neurology

## 2021-03-21 ENCOUNTER — Encounter: Payer: Self-pay | Admitting: Neurology

## 2021-03-21 VITALS — BP 121/82 | HR 82 | Ht 69.0 in | Wt 226.0 lb

## 2021-03-21 DIAGNOSIS — R412 Retrograde amnesia: Secondary | ICD-10-CM | POA: Diagnosis not present

## 2021-03-21 DIAGNOSIS — M542 Cervicalgia: Secondary | ICD-10-CM | POA: Insufficient documentation

## 2021-03-21 DIAGNOSIS — G3184 Mild cognitive impairment, so stated: Secondary | ICD-10-CM | POA: Diagnosis not present

## 2021-03-21 DIAGNOSIS — S069X1A Unspecified intracranial injury with loss of consciousness of 30 minutes or less, initial encounter: Secondary | ICD-10-CM

## 2021-03-21 MED ORDER — DONEPEZIL HCL 10 MG PO TABS: 10.0000 mg | ORAL_TABLET | Freq: Every day | ORAL | 3 refills | Status: DC

## 2021-03-21 NOTE — Patient Instructions (Signed)
Traumatic Brain Injury Traumatic brain injury (TBI) is an injury to the brain that results from: A hard, direct blow to the head (closed injury). An object penetrating the skull and entering the brain (open injury). Traumatic brain injury is also called a head injury or a concussion. TBI can bemild, moderate, or severe. What are the causes? Common causes of this condition include: Falls. Motor vehicle accidents. Sports injuries. Assaults. What increases the risk? You are more likely to develop this condition if you: Are 48 years old or older. Are a man. Play contact sports, especially football, hockey, or soccer. Are in the [employer]. Are a victim of violence. Abuse drugs or alcohol. Have had a previous TBI. What are the signs or symptoms? Symptoms may vary from person to person, and may include: Loss of consciousness. Headache. Confusion. Fatigue. Changes in sleep. Dizziness. Mood or personality changes. Memory problems. Nausea or vomiting or both. Seizures. Clumsiness. Slurred speech. Depression and anxiety. Anger. Trouble concentrating, organizing, or making decisions. Inability to control emotions or actions (impulse control). Loss of or dulling of the senses, such as hearing, vision, and touch. This can include: Blurred vision. Ringing in your ears. How is this diagnosed? This condition may be diagnosed based on: Medical history and physical exam. Neurologic exam. This checks for brain and nervous system function, including your reflexes, memory, and coordination. CT scan. Your TBI may be described as mild, moderate, or severe. How is this treated? Treatment depends on the severity of your brain injury and may include: Breathing support (mechanical ventilation). Blood pressure medicines. Pain medicines. Treatments to decrease the swelling in your brain. Brain surgery. This may be needed to: Remove a blood clot. Repair bleeding. Remove an object that has  penetrated the brain, such as a skull fragment or a bullet. Treatment of TBI also includes: Physical and mental rest. Careful observation. Medicine. You may be prescribed medicines to help with symptoms such as headaches, nausea, or difficulty sleeping. Physical, occupational, and speech therapy. Referral to a concussion clinic or rehabilitation center. Follow these instructions at home: Medicines Take over-the-counter and prescription medicines only as told by your health care provider. Do not take blood thinners (anticoagulants), aspirin, or other anti-inflammatory medicines such as ibuprofen or naproxen unless approved by your health care provider. Activity Rest. Rest helps the brain to heal. Make sure you: Get plenty of sleep. Most adults should get 7-9 hours of sleep each night. Rest during the day. Take daytime naps or rest breaks when you feel tired. Do not do high-risk activities that could cause a second concussion, such as riding a bike or playing sports. Having another concussion before the first one has healed can be dangerous. Avoid a lot of visual stimulation. This includes work on the computer or phone, watching TV, and reading. Ask your health care provider what kind of activities are safe for you. Your ability to react may be slower after a brain injury. Never do these activities if you are dizzy. Your health care provider will likely give you a plan for gradually returning to activities. General instructions Do not drink alcohol. Watch your symptoms and tell others to do the same. Complications sometimes occur after a brain injury. Older adults with a brain injury may have a higher risk of serious complications. Seek support from friends and family. Keep all follow-up visits as directed by your health care provider. This is important. Contact a health care provider if: Your symptoms get worse or they do not improve. You have  new symptoms. You have another injury. Get help  right away if: You have: Severe, persistent headaches that are not relieved by medicine. Weakness or numbness in any part of your body. Confusion. Slurred speech. Difficulty waking up. Nausea or persistent vomiting. A feeling like you are moving when you are not (vertigo). Seizures or you faint. Changes in your vision. Clear or bloody discharge from your nose or ears. You cannot use your arms or legs normally. Summary Traumatic brain injury happens when there is a hard, direct blow to the head or when an object penetrates the skull and enters the brain. Traumatic brain injuries may be mild, moderate, or severe. Treatment depends on the severity of your injury. Get help right away if you have a head injury and you develop seizures, confusion, vomiting, weakness in the arms or legs, slurred speech, and other symptoms. Rest is one of the best treatments. Do not return to activity until your health care provider approves. This information is not intended to replace advice given to you by your health care provider. Make sure you discuss any questions you have with your healthcare provider. Document Revised: 12/18/2017 Document Reviewed: 09/30/2017 Elsevier Patient Education  [place].
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
## 2021-03-31 ENCOUNTER — Encounter: Payer: Self-pay | Admitting: Family Medicine

## 2021-03-31 ENCOUNTER — Other Ambulatory Visit: Payer: Self-pay

## 2021-03-31 ENCOUNTER — Ambulatory Visit (INDEPENDENT_AMBULATORY_CARE_PROVIDER_SITE_OTHER): Payer: 59 | Admitting: Family Medicine

## 2021-03-31 DIAGNOSIS — M25512 Pain in left shoulder: Secondary | ICD-10-CM | POA: Diagnosis not present

## 2021-03-31 DIAGNOSIS — M25572 Pain in left ankle and joints of left foot: Secondary | ICD-10-CM | POA: Diagnosis not present

## 2021-03-31 DIAGNOSIS — M25562 Pain in left knee: Secondary | ICD-10-CM

## 2021-03-31 DIAGNOSIS — M542 Cervicalgia: Secondary | ICD-10-CM

## 2021-03-31 DIAGNOSIS — M79642 Pain in left hand: Secondary | ICD-10-CM
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
## 2021-04-19 ENCOUNTER — Telehealth: Payer: Self-pay | Admitting: Family Medicine

## 2021-04-19 DIAGNOSIS — M25562 Pain in left knee: Secondary | ICD-10-CM

## 2021-04-19 NOTE — Telephone Encounter (Signed)
Patient's wife Deatra Canter called advised patient is having the MRI tomorrow and his knee should have been added  to the MRI. Deatra Canter asked if [facility] could be contacted so that patient's knee can be included in the MRI tomorrow. The number to contact Deatra Canter is [phone]   Patient number is [phone]

## 2021-04-19 NOTE — Telephone Encounter (Signed)
I called and reached Gary Knight's voice mail. Left a message that the knee MRI has been ordered. Our referral coordinator has been in contact with [facility] to see if it can be added to the same appointment time -- they are completely booked. He can either have the MRI of the knee on a different day, or he can cancel the appointment for tomorrow and have all 3 of the MRIs done on a new appointment day/time. Provided phone number to [place] for if she or he would like to contact them directly.

## 2021-04-19 NOTE — Telephone Encounter (Signed)
I called and spoke with both the patient and his wife on 3-way call: the patient states the left knee still swells and he has much pain with applying pressure to the knee. The abrasion site is still indented. He is asking that an MRI be ordered on the knee, as well. Please advise.

## 2021-04-20 ENCOUNTER — Other Ambulatory Visit: Payer: 59

## 2021-05-02 ENCOUNTER — Other Ambulatory Visit: Payer: Self-pay

## 2021-05-02 ENCOUNTER — Ambulatory Visit
Admission: RE | Admit: 2021-05-02 | Discharge: 2021-05-02 | Disposition: A | Discharge status: Home / Self Care | Payer: 59 | Source: Ambulatory Visit | Attending: Family Medicine | Admitting: Family Medicine

## 2021-05-02 DIAGNOSIS — M25512 Pain in left shoulder: Secondary | ICD-10-CM

## 2021-05-02 DIAGNOSIS — M25562 Pain in left knee: Secondary | ICD-10-CM

## 2021-05-02 DIAGNOSIS — M25572 Pain in left ankle and joints of left foot: Secondary | ICD-10-CM

## 2021-05-10 ENCOUNTER — Ambulatory Visit: Payer: 59 | Admitting: Orthopedic Surgery

## 2021-05-10 ENCOUNTER — Other Ambulatory Visit: Payer: Self-pay

## 2021-05-10 ENCOUNTER — Telehealth: Payer: Self-pay

## 2021-05-10 DIAGNOSIS — M25572 Pain in left ankle and joints of left foot: Secondary | ICD-10-CM

## 2021-05-10 NOTE — Telephone Encounter (Signed)
Per Dr. Marlou Sa pt needs appt with dr. Tempie Donning for thumb

## 2021-05-11 NOTE — Telephone Encounter (Signed)
Can you please call pt to schedule this?

## 2021-05-12 ENCOUNTER — Ambulatory Visit: Payer: Self-pay

## 2021-05-12 ENCOUNTER — Ambulatory Visit: Payer: 59 | Admitting: Orthopedic Surgery

## 2021-05-12 ENCOUNTER — Other Ambulatory Visit: Payer: Self-pay

## 2021-05-12 VITALS — BP 142/92 | HR 99 | Ht 70.0 in | Wt 224.0 lb

## 2021-05-12 DIAGNOSIS — M25542 Pain in joints of left hand: Secondary | ICD-10-CM | POA: Diagnosis not present

## 2021-05-12 DIAGNOSIS — R29898 Other symptoms and signs involving the musculoskeletal system: Secondary | ICD-10-CM
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
## 2021-05-14 ENCOUNTER — Encounter: Payer: Self-pay | Admitting: Orthopedic Surgery
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
## 2021-05-16 ENCOUNTER — Telehealth: Payer: Self-pay | Admitting: Physical Medicine and Rehabilitation

## 2021-05-16 NOTE — Telephone Encounter (Signed)
Pt returned call to [facility]. Please call pt at [phone].

## 2021-05-22 ENCOUNTER — Telehealth: Payer: Self-pay | Admitting: Orthopedic Surgery

## 2021-05-31 ENCOUNTER — Ambulatory Visit
Admission: RE | Admit: 2021-05-31 | Discharge: 2021-05-31 | Disposition: A | Discharge status: Home / Self Care | Payer: 59 | Source: Ambulatory Visit | Attending: Orthopedic Surgery | Admitting: Orthopedic Surgery

## 2021-05-31 ENCOUNTER — Other Ambulatory Visit: Payer: Self-pay

## 2021-05-31 DIAGNOSIS — M25572 Pain in left ankle and joints of left foot: Secondary | ICD-10-CM

## 2021-06-05 ENCOUNTER — Ambulatory Visit (INDEPENDENT_AMBULATORY_CARE_PROVIDER_SITE_OTHER): Payer: 59 | Admitting: Orthopedic Surgery

## 2021-06-05 ENCOUNTER — Other Ambulatory Visit: Payer: Self-pay

## 2021-06-05 DIAGNOSIS — M25512 Pain in left shoulder: Secondary | ICD-10-CM

## 2021-06-05 DIAGNOSIS — M25572 Pain in left ankle and joints of left foot: Secondary | ICD-10-CM | POA: Diagnosis not present

## 2021-06-06 ENCOUNTER — Encounter: Payer: Self-pay | Admitting: Physical Medicine and Rehabilitation

## 2021-06-06 ENCOUNTER — Ambulatory Visit (INDEPENDENT_AMBULATORY_CARE_PROVIDER_SITE_OTHER): Payer: 59 | Admitting: Physical Medicine and Rehabilitation

## 2021-06-06 DIAGNOSIS — R202 Paresthesia of skin: Secondary | ICD-10-CM | POA: Diagnosis not present
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
## 2021-06-08 NOTE — Procedures (Signed)
EMG & NCV Findings: All nerve conduction studies (as indicated in the following tables) were within normal limits.    All examined muscles (as indicated in the following table) showed no evidence of electrical instability.    Impression: Essentially NORMAL electrodiagnostic study of the left upper limb.  There is no significant electrodiagnostic evidence of.    As you know, purely sensory or demyelinating radiculopathies and chemical radiculitis may not be detected with this particular electrodiagnostic study.  Recommendations: 1.  Follow-up with referring physician. 2.  Continue current management of symptoms.  ___________________________ Laurence Spates FAAPMR Board Certified, American Board of Physical Medicine and Rehabilitation    Nerve Conduction Studies Anti Sensory Summary Table   Stim Site NR Peak (ms) Norm Peak (ms) P-T Amp (V) Norm P-T Amp Site1 Site2 Delta-P (ms) Dist (cm) Vel (m/s) Norm Vel (m/s)  Left Median Acr Palm Anti Sensory (2nd Digit)  32.1C  Wrist    3.3 <3.6 19.1 >10 Wrist Palm 1.5 0.0    Palm    1.8 <2.0 6.9         Left Radial Anti Sensory (Base 1st Digit)  32.1C  Wrist    1.8 <3.1 40.7  Wrist Base 1st Digit 1.8 0.0    Left Ulnar Anti Sensory (5th Digit)  32.5C  Wrist    3.0 <3.7 22.1 >15.0 Wrist 5th Digit 3.0 14.0 47 >38   Motor Summary Table   Stim Site NR Onset (ms) Norm Onset (ms) O-P Amp (mV) Norm O-P Amp Site1 Site2 Delta-0 (ms) Dist (cm) Vel (m/s) Norm Vel (m/s)  Left Median Motor (Abd Poll Brev)  32.4C  Wrist    3.4 <4.2 8.2 >5 Elbow Wrist 4.3 23.0 53 >50  Elbow    7.7  8.0         Left Ulnar Motor (Abd Dig Min)  32.4C  Wrist    2.7 <4.2 8.8 >3 B Elbow Wrist 3.6 23.0 64 >53  B Elbow    6.3  7.8  A Elbow B Elbow 1.4 10.0 71 >53  A Elbow    7.7  7.8          EMG   Side Muscle Nerve Root Ins Act Fibs Psw Amp Dur Poly Recrt Int Fraser Din Comment  Left Abd Poll Brev Median C8-T1 Nml Nml Nml Nml Nml 0 Nml Nml   Left 1stDorInt Ulnar C8-T1 Nml Nml  Nml Nml Nml 0 Nml Nml   Left PronatorTeres Median C6-7 Nml Nml Nml Nml Nml 0 Nml Nml   Left Biceps Musculocut C5-6 Nml Nml Nml Nml Nml 0 Nml Nml   Left Deltoid Axillary C5-6 Nml Nml Nml Nml Nml 0 Nml Nml     Nerve Conduction Studies Anti Sensory Left/Right Comparison   Stim Site L Lat (ms) R Lat (ms) L-R Lat (ms) L Amp (V) R Amp (V) L-R Amp (%) Site1 Site2 L Vel (m/s) R Vel (m/s) L-R Vel (m/s)  Median Acr Palm Anti Sensory (2nd Digit)  32.1C  Wrist 3.3   19.1   Wrist Palm     Palm 1.8   6.9         Radial Anti Sensory (Base 1st Digit)  32.1C  Wrist 1.8   40.7   Wrist Base 1st Digit     Ulnar Anti Sensory (5th Digit)  32.5C  Wrist 3.0   22.1   Wrist 5th Digit 47     Motor Left/Right Comparison   Stim Site L Lat (ms) R  Lat (ms) L-R Lat (ms) L Amp (mV) R Amp (mV) L-R Amp (%) Site1 Site2 L Vel (m/s) R Vel (m/s) L-R Vel (m/s)  Median Motor (Abd Poll Brev)  32.4C  Wrist 3.4   8.2   Elbow Wrist 53    Elbow 7.7   8.0         Ulnar Motor (Abd Dig Min)  32.4C  Wrist 2.7   8.8   B Elbow Wrist 64    B Elbow 6.3   7.8   A Elbow B Elbow 71    A Elbow 7.7   7.8            Waveforms:
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
## 2021-06-09 ENCOUNTER — Encounter: Payer: Self-pay | Admitting: Orthopedic Surgery

## 2021-06-09 NOTE — Progress Notes (Signed)
Office Visit Note   Patient: Gary Knight           Date of Birth: 1970/12/18           MRN: [id number] Visit Date: 06/05/2021 Requested by: Susy Frizzle, MD [address] [place],  [place] [address] PCP: Susy Frizzle, MD  Subjective: Chief Complaint  Patient presents with   Other     Scan review    HPI: Gary Knight is a 50 year old patient who is here for scan review of his left ankle.  Patient wears boots at work.  Left ankle remains occasionally symptomatically unstable.  Morning time is worse.  Walking on the beach is difficult.  MRI scan is reviewed.  That scan shows low-grade acute/subacute sprain of the ATFL with chronic anterior tib-fib ligament sprain but no osseous abnormality and no acute tendon abnormality.  He has been seen for his hand.  Neck evaluation pending.  He does have near full-thickness rotator cuff tear and SLAP tear in the left shoulder.  Surgery is pending for this as well.              ROS: All systems reviewed are negative as they relate to the chief complaint within the history of present illness.  Patient denies  fevers or chills.   Assessment & Plan: Visit Diagnoses:  1. Acute pain of left shoulder   2. Pain in left ankle and joints of left foot     Plan: Impression is left ankle pain with ligament damage but no acute tendon damage.  Plan is we talked about acute rehabilitation versus observation.  In general I think this ankle could get better over time.  He is going to continue wearing boots at work and be careful otherwise.  Regarding the shoulder I think he is heading for surgery at sometime in the future.  Now is not a great time for him.  The pathology that is present in the shoulder is addressable and since this is a 90 to 95% thickness tear do not anticipate retraction of the tear prior to surgical intervention unless something drastic happens.  Plan to see him back in 3 months for clinical recheck.  Follow-Up Instructions: Return  in about 3 months (around 09/05/2021).   Orders:  No orders of the defined types were placed in this encounter.  No orders of the defined types were placed in this encounter.     Procedures: No procedures performed   Clinical Data: No additional findings.  Objective: Vital Signs: There were no vitals taken for this visit.  Physical Exam:   Constitutional: Patient appears well-developed HEENT:  Head: Normocephalic Eyes:EOM are normal Neck: Normal range of motion Cardiovascular: Normal rate Pulmonary/chest: Effort normal Neurologic: Patient is alert Skin: Skin is warm Psychiatric: Patient has normal mood and affect Examination of the left ankle demonstrates good ankle dorsiflexion plantarflexion inversion eversion strength.  Mild swelling around the ankle joint is present.  Slightly more instability varus tilt testing anterior drawer testing left versus right.  Shoulder exam demonstrates pretty reasonable strength with no restriction of passive range of motion.  O'Brien's testing equivocal on the left negative on the right no AC joint tenderness present.  Ortho Exam: See above  Specialty Comments:  No specialty comments available.  Imaging: No results found.   PMFS History: Patient Active Problem List   Diagnosis Date Noted   MCI (mild cognitive impairment) with memory loss 03/21/2021   Amnesia (retrograde) 03/21/2021  Cervicalgia of occipito-atlanto-axial region 03/21/2021   Diffuse axonal brain injury 02/22/2021   TBI (traumatic brain injury) 02/22/2021   Acute bilateral low back pain with bilateral sciatica 06/23/2018   Osteoarthritis of spine with radiculopathy, lumbar region 06/23/2018   Headache(784.0) 02/12/2013   History reviewed. No pertinent past medical history.  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

## 2021-06-20 ENCOUNTER — Other Ambulatory Visit: Payer: Self-pay

## 2021-06-20 ENCOUNTER — Ambulatory Visit (INDEPENDENT_AMBULATORY_CARE_PROVIDER_SITE_OTHER): Payer: 59 | Admitting: Orthopedic Surgery

## 2021-06-20 DIAGNOSIS — R29898 Other symptoms and signs involving the musculoskeletal system: Secondary | ICD-10-CM | POA: Diagnosis not present
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
## 2021-07-25 ENCOUNTER — Other Ambulatory Visit: Payer: Self-pay

## 2021-07-25 ENCOUNTER — Ambulatory Visit: Payer: 59 | Admitting: Family Medicine

## 2021-07-25 ENCOUNTER — Encounter: Payer: Self-pay | Admitting: Family Medicine

## 2021-07-25 VITALS — BP 133/90 | HR 75 | Ht 69.5 in | Wt 230.0 lb

## 2021-07-25 DIAGNOSIS — R42 Dizziness and giddiness: Secondary | ICD-10-CM

## 2021-07-25 DIAGNOSIS — G3184 Mild cognitive impairment, so stated: Secondary | ICD-10-CM

## 2021-07-25 DIAGNOSIS — S069X1A Unspecified intracranial injury with loss of consciousness of 30 minutes or less, initial encounter: Secondary | ICD-10-CM | POA: Diagnosis not present
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
## 2021-07-25 NOTE — Patient Instructions (Signed)
Below is our plan:  We will continue Aricept 10mg  daily. Discuss dizziness symptoms with PCP. Symptoms sound like possible vertigo but could be related to BP issues. I recommend checking labs with PCP for other causes of dizziness. Consider meclizine or dramamine if you wish. Consider neurocognitive testing with neuropsychologist if you wish.   Please make sure you are staying well hydrated. I recommend 50-60 ounces daily. Well balanced diet and regular exercise encouraged. Consistent sleep schedule with 6-8 hours recommended.   Please continue follow up with care team as directed.   Follow up with me in 6 months  You may receive a survey regarding today's visit. I encourage you to leave honest feed back as I do use this information to improve patient care. Thank you for seeing me today!   Management of Memory Problems   There are some general things you can do to help manage your memory problems.  Your memory may not in fact recover, but by using techniques and strategies you will be able to manage your memory difficulties better.   1)  Establish a routine. Try to establish and then stick to a regular routine.  By doing this, you will get used to what to expect and you will reduce the need to rely on your memory.  Also, try to do things at the same time of day, such as taking your medication or checking your calendar first thing in the morning. Think about think that you can do as a part of a regular routine and make a list.  Then enter them into a daily planner to remind you.  This will help you establish a routine.   2)  Organize your environment. Organize your environment so that it is uncluttered.  Decrease visual stimulation.  Place everyday items such as keys or cell phone in the same place every day (ie.  Basket next to front door) Use post it notes with a brief message to yourself (ie. Turn off light, lock the door) Use labels to indicate where things go (ie. Which cupboards are for  food, dishes, etc.) Keep a notepad and pen by the telephone to take messages   3)  Memory Aids A diary or journal/notebook/daily planner Making a list (shopping list, chore list, to do list that needs to be done) Using an alarm as a reminder (kitchen timer or cell phone alarm) Using cell phone to store information (Notes, Calendar, Reminders) Calendar/White board placed in a prominent position Post-it notes   In order for memory aids to be useful, you need to have good habits.  It's no good remembering to make a note in your journal if you don't remember to look in it.  Try setting aside a certain time of day to look in journal.   4)  Improving mood and managing fatigue. There may be other factors that contribute to memory difficulties.  Factors, such as anxiety, depression and tiredness can affect memory. Regular gentle exercise can help improve your mood and give you more energy. Simple relaxation techniques may help relieve symptoms of anxiety Try to get back to completing activities or hobbies you enjoyed doing in the past. Learn to pace yourself through activities to decrease fatigue. Find out about some local support groups where you can share experiences with others. Try and achieve 7-8 hours of sleep at night.

## 2021-08-01 ENCOUNTER — Other Ambulatory Visit: Payer: Self-pay | Admitting: Neurology

## 2021-08-01 ENCOUNTER — Other Ambulatory Visit: Payer: Self-pay | Admitting: Radiology

## 2021-08-01 NOTE — Telephone Encounter (Signed)
Patient's wife called, and says patient would like a refill of flexeril 10 mg, last prescribed by Dr Junius Roads.  Can you please refill?  Last filled 04/02/21, and there is a refill still per the label, but for some reason pharmacy will not refill it for him.  Maybe to do with it being from Dr Junius Roads?   Uses [facility].  Thanks.

## 2021-08-01 NOTE — Telephone Encounter (Signed)
Ok to rf? 

## 2021-08-02 MED ORDER — CYCLOBENZAPRINE HCL 10 MG PO TABS: 10.0000 mg | ORAL_TABLET | Freq: Three times a day (TID) | ORAL | 3 refills | Status: DC | PRN

## 2021-11-01 ENCOUNTER — Other Ambulatory Visit: Payer: Self-pay

## 2021-11-01 ENCOUNTER — Ambulatory Visit: Payer: 59 | Admitting: Orthopedic Surgery

## 2021-11-01 DIAGNOSIS — M79602 Pain in left arm: Secondary | ICD-10-CM | POA: Diagnosis not present

## 2021-11-03 ENCOUNTER — Encounter: Payer: Self-pay | Admitting: Orthopedic Surgery
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
## 2021-12-09 ENCOUNTER — Ambulatory Visit
Admission: RE | Admit: 2021-12-09 | Discharge: 2021-12-09 | Disposition: A | Discharge status: Home / Self Care | Payer: 59 | Source: Ambulatory Visit | Attending: Surgical | Admitting: Surgical

## 2021-12-09 DIAGNOSIS — M79602 Pain in left arm: Secondary | ICD-10-CM

## 2021-12-27 ENCOUNTER — Ambulatory Visit: Payer: 59 | Admitting: Orthopedic Surgery

## 2022-01-08 NOTE — Telephone Encounter (Signed)
err

## 2022-01-17 ENCOUNTER — Ambulatory Visit: Payer: 59 | Admitting: Orthopedic Surgery

## 2022-01-17 ENCOUNTER — Encounter: Payer: Self-pay | Admitting: Orthopedic Surgery

## 2022-01-17 DIAGNOSIS — M25512 Pain in left shoulder: Secondary | ICD-10-CM
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
## 2022-01-24 ENCOUNTER — Ambulatory Visit: Payer: 59 | Admitting: Family Medicine

## 2022-03-30 ENCOUNTER — Other Ambulatory Visit: Payer: Self-pay | Admitting: Neurology

## 2022-05-02 ENCOUNTER — Ambulatory Visit: Payer: 59 | Admitting: Surgical

## 2022-05-02 ENCOUNTER — Encounter: Payer: Self-pay | Admitting: Orthopedic Surgery

## 2022-05-02 DIAGNOSIS — M75112 Incomplete rotator cuff tear or rupture of left shoulder, not specified as traumatic: Secondary | ICD-10-CM

## 2022-05-02 DIAGNOSIS — M25512 Pain in left shoulder: Secondary | ICD-10-CM | POA: Diagnosis not present
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
## 2022-05-21 ENCOUNTER — Ambulatory Visit
Admission: RE | Admit: 2022-05-21 | Discharge: 2022-05-21 | Disposition: A | Discharge status: Home / Self Care | Payer: 59 | Source: Ambulatory Visit | Attending: Orthopedic Surgery | Admitting: Orthopedic Surgery

## 2022-05-21 DIAGNOSIS — M25512 Pain in left shoulder: Secondary | ICD-10-CM

## 2022-05-21 MED ORDER — IOPAMIDOL (ISOVUE-M 200) INJECTION 41%
15.0000 mL | Freq: Once | INTRAMUSCULAR | Status: AC
  Administered 2022-05-21: 15 mL via INTRA_ARTICULAR

## 2022-05-25 ENCOUNTER — Telehealth: Payer: Self-pay

## 2022-05-25 NOTE — Telephone Encounter (Signed)
-----   Message from Meredith Pel, MD sent at 05/25/2022  9:11 AM EDT ----- Needs f u thx

## 2022-05-25 NOTE — Progress Notes (Signed)
Needs f/u thx

## 2022-05-25 NOTE — Telephone Encounter (Signed)
IC LMVM for patient to call and schedule

## 2022-07-09 ENCOUNTER — Ambulatory Visit (INDEPENDENT_AMBULATORY_CARE_PROVIDER_SITE_OTHER): Payer: 59

## 2022-07-09 ENCOUNTER — Ambulatory Visit: Payer: 59 | Admitting: Orthopedic Surgery

## 2022-07-09 ENCOUNTER — Ambulatory Visit: Payer: Self-pay

## 2022-07-09 DIAGNOSIS — M67432 Ganglion, left wrist: Secondary | ICD-10-CM

## 2022-07-11 ENCOUNTER — Encounter: Payer: Self-pay | Admitting: Orthopedic Surgery

## 2022-07-11 DIAGNOSIS — M67432 Ganglion, left wrist: Secondary | ICD-10-CM

## 2022-07-11 MED ORDER — LIDOCAINE HCL 1 % IJ SOLN
3.0000 mL | INTRAMUSCULAR | Status: AC | PRN
  Administered 2022-07-11: 3 mL
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
## 2022-08-24 IMAGING — MR MR CERVICAL SPINE W/O CM
4 of 5 series · 27 of 48 positions shown · non-contrast
Comparison: Prior radiograph from 03/26/2018.

CLINICAL DATA: Initial evaluation for left upper extremity pain and
weakness.

EXAM:
MRI CERVICAL SPINE WITHOUT CONTRAST
TECHNIQUE: Multiplanar, multisequence MR imaging of the cervical spine was
performed. No intravenous contrast was administered.

[Series 2: T2 · sagittal · 3.0mm · 0.66mm/px · 6 of 15 slices shown (1 of 2)]
[im 1/15]
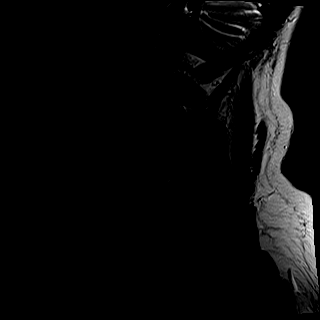
[im 3/15]
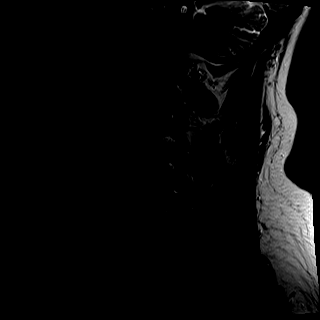
[im 6/15]
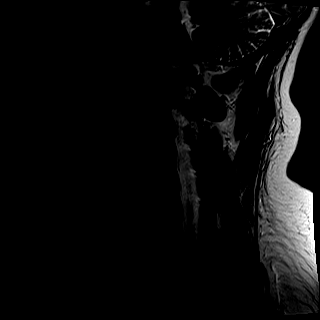
[im 9/15]
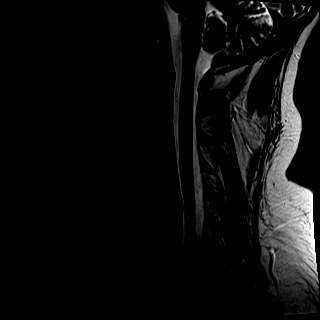
[im 12/15]
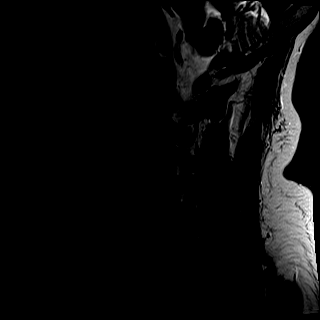
[im 15/15]
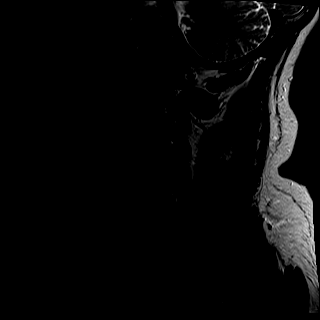

[Series 3: T1 · sagittal · 3.0mm · 0.41mm/px · 7 of 15 slices shown]
[im 1/15]
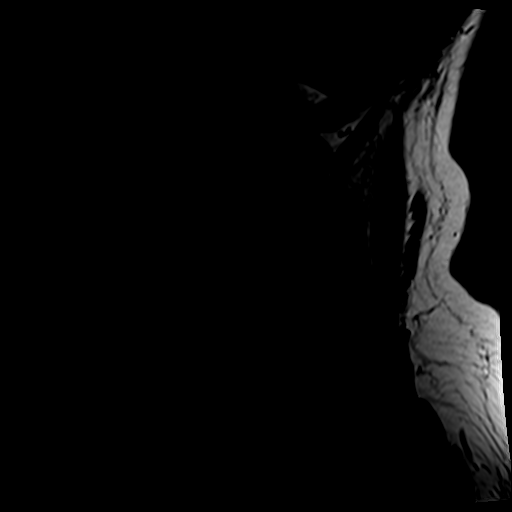
[im 3/15]
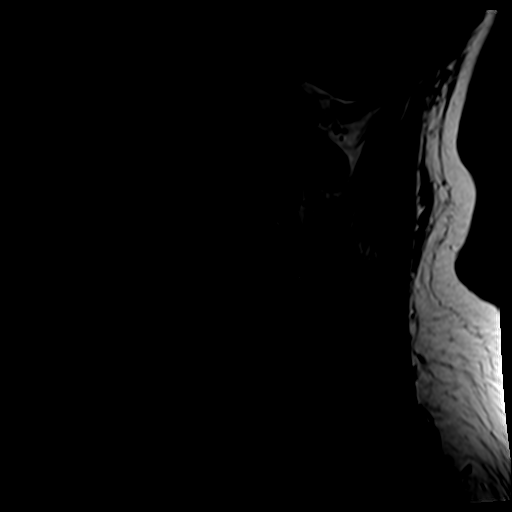
[im 5/15]
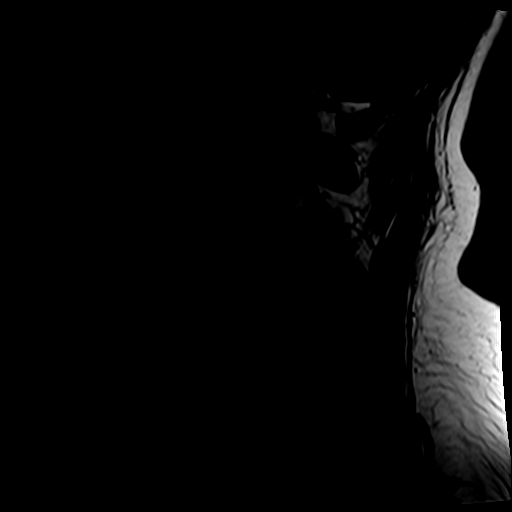
[im 8/15]
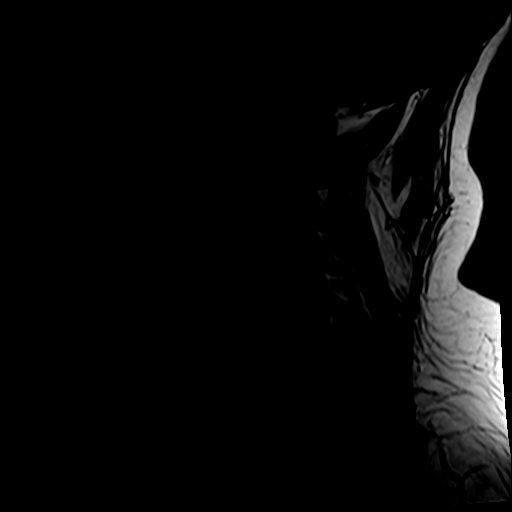
[im 10/15]
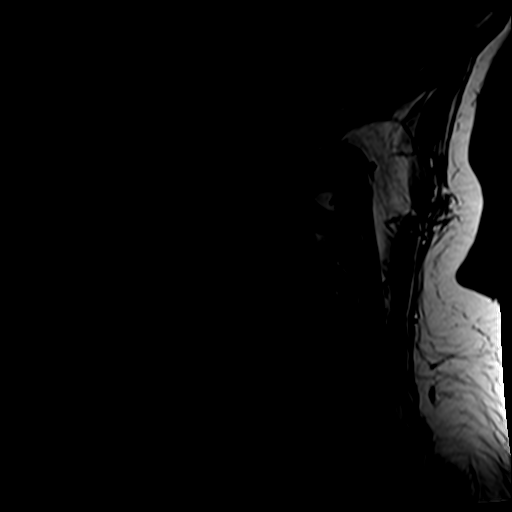
[im 12/15]
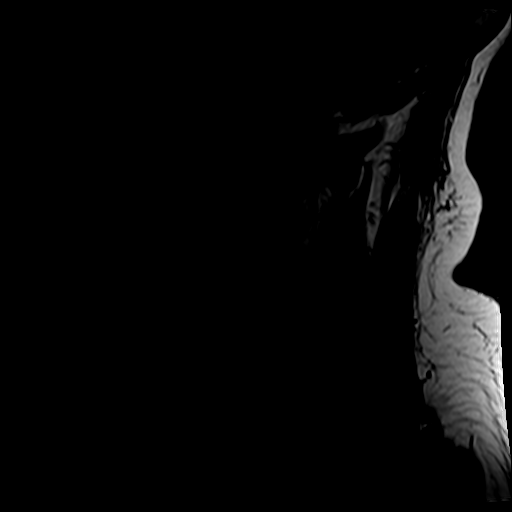
[im 15/15]
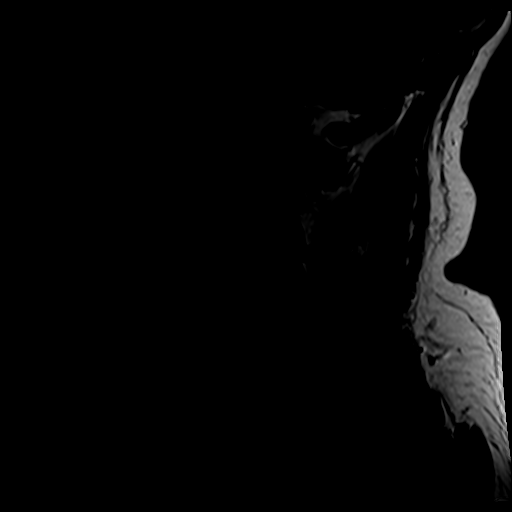

[Series 4: tir sag · sagittal · 3.0mm · 0.41mm/px · 6 of 15 slices shown]
[im 1/15]
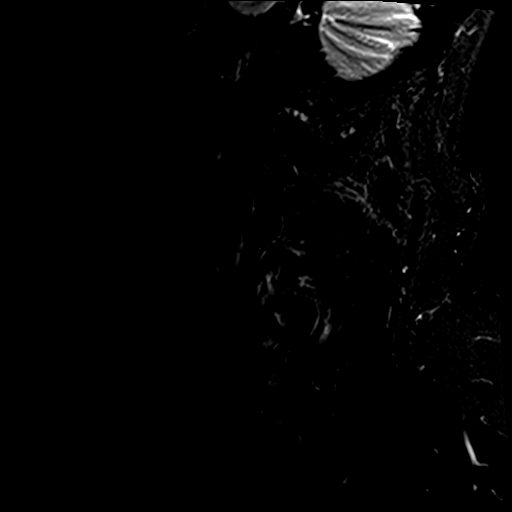
[im 3/15]
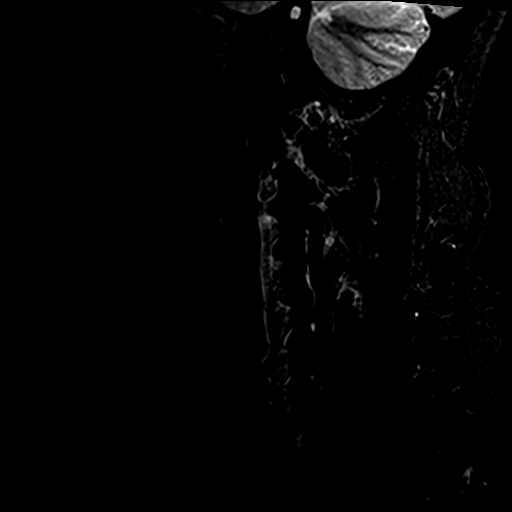
[im 5/15]
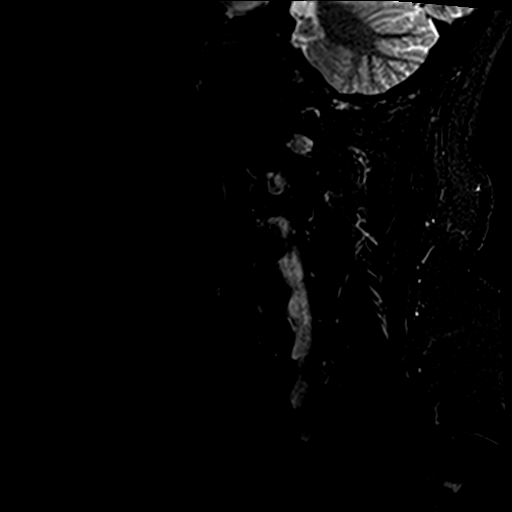
[im 8/15]
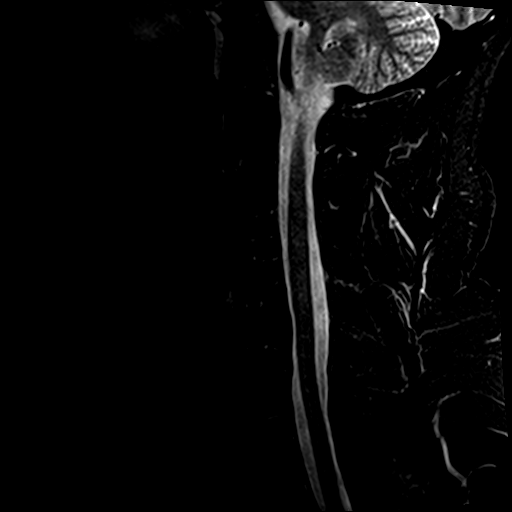
[im 10/15]
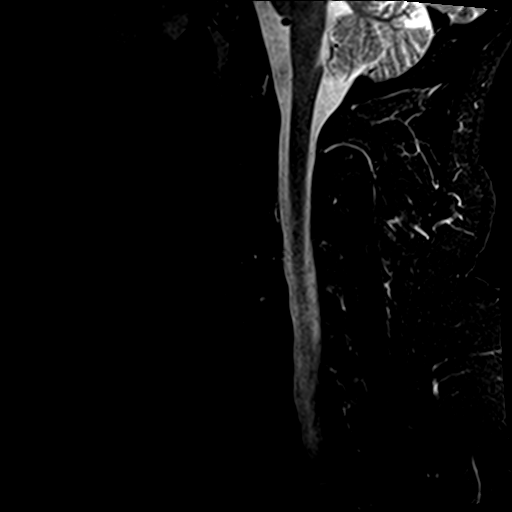
[im 12/15]
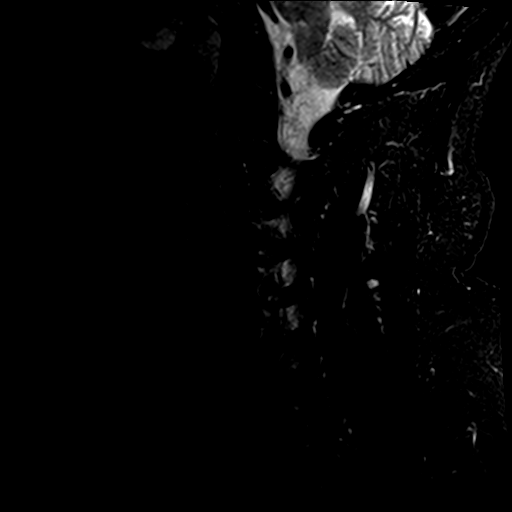

[Series 6: T2 · axial · 3.0mm · 0.70mm/px · z∈[-74,+30]mm · 8 of 29 slices shown (2 of 2)]
[im 1/29]
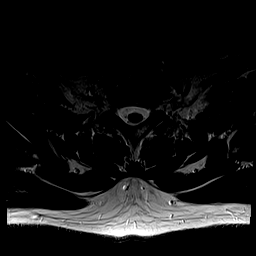
[im 5/29]
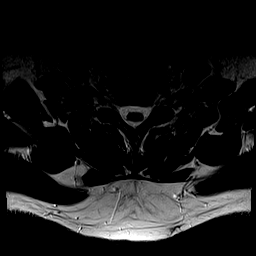
[im 9/29]
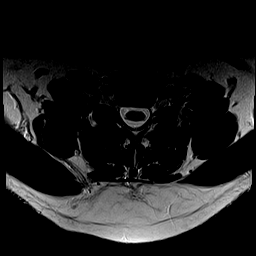
[im 13/29]
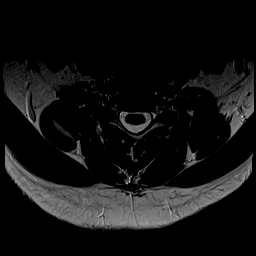
[im 16/29]
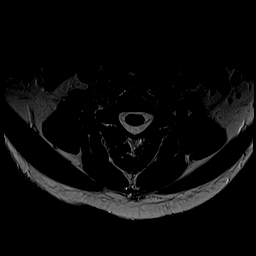
[im 20/29]
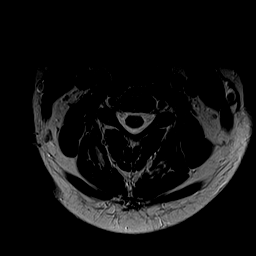
[im 24/29]
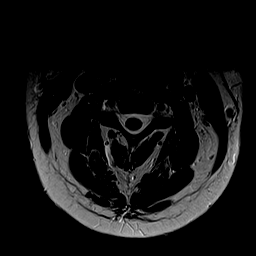
[im 29/29]
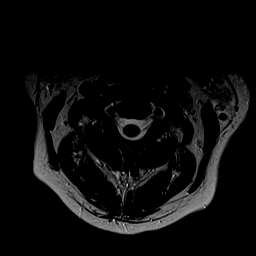

[27 of 48 positions shown; findings below may reference images not displayed]

FINDINGS: Alignment: Straightening with mild reversal of the normal cervical
lordosis, apex at C5-6. No listhesis.

Vertebrae: Vertebral body height maintained without acute or chronic
fracture. Bone marrow signal intensity normal. No discrete or
worrisome osseous lesions or abnormal marrow edema.

Cord: Normal signal and morphology.

Posterior Fossa, vertebral arteries, paraspinal tissues: Visualized
brain and posterior fossa within normal limits. Craniocervical
junction normal. Paraspinous and prevertebral soft tissues within
normal limits. Normal intravascular flow voids seen within the
vertebral arteries bilaterally.

Disc levels:

C2-C3: Unremarkable.

C3-C4: Mild uncovertebral hypertrophy without significant disc
bulge. No canal or foraminal stenosis.

C4-C5: Mild uncovertebral hypertrophy without significant disc
bulge. No canal or foraminal stenosis.

C5-C6: Mild disc bulge. Superimposed shallow right paracentral disc
protrusion mildly indents the right ventral thecal sac (series 5,
image 16). No significant spinal stenosis or cord deformity.
Foramina remain patent.

C6-C7: Minimal annular disc bulge. No spinal stenosis. Foramina
remain patent.

C7-T1: Negative interspace. Mild facet hypertrophy. No significant
canal or foraminal stenosis.

Visualized upper thoracic spine demonstrates no significant finding.
IMPRESSION: 1. Shallow right paracentral disc protrusion at C5-6 without
significant stenosis or cord impingement.
2. Minimal noncompressive disc bulging at C6-7 without stenosis.
3. Otherwise essentially negative MRI of the cervical spine. No
other findings to explain patient's left upper extremity symptoms
identified. She

## 2022-10-25 ENCOUNTER — Encounter: Payer: Self-pay | Admitting: Radiology

## 2023-06-07 ENCOUNTER — Encounter: Payer: Self-pay | Admitting: Family Medicine

## 2024-06-10 ENCOUNTER — Encounter: Payer: Self-pay | Admitting: Podiatry

## 2024-06-10 ENCOUNTER — Ambulatory Visit (INDEPENDENT_AMBULATORY_CARE_PROVIDER_SITE_OTHER)

## 2024-06-10 ENCOUNTER — Ambulatory Visit: Admitting: Podiatry

## 2024-06-10 DIAGNOSIS — M722 Plantar fascial fibromatosis: Secondary | ICD-10-CM | POA: Diagnosis not present

## 2024-06-10 MED ORDER — TRIAMCINOLONE ACETONIDE 10 MG/ML IJ SUSP
10.0000 mg | Freq: Once | INTRAMUSCULAR | Status: AC
  Administered 2024-06-10: 10 mg via INTRA_ARTICULAR

## 2024-06-10 NOTE — Progress Notes (Signed)
 Deduct still owes $32.32 / then 20% coins for DME  Patient is aware  Ref# [id number]    Orthotics   Patient was present and evaluated for Custom molded foot orthotics. Patient will benefit from CFO's to provide total contact to BIL MLA's helping to balance and distribute body weight more evenly across BIL feet helping to reduce plantar pressure and pain. Orthotic will also encourage FF / RF alignment  Patient was scanned today and will return for fitting upon receipt  Lolita Schultze CPed, CFo, CFm

## 2024-06-10 NOTE — Progress Notes (Signed)
 Subjective:   Patient ID: Gary Knight, male   DOB: 53 y.o.   MRN: [id number]   HPI Patient presents stating that he has had chronic fasciitis that had done well years ago with orthotics and has pain that has intensified in the right heel and arch over the last few months.  Patient does not smoke and likes to be active   Review of Systems  All other systems reviewed and are negative.       Objective:  Physical Exam Vitals and nursing note reviewed.  Constitutional:      Appearance: He is well-developed.  Pulmonary:     Effort: Pulmonary effort is normal.  Musculoskeletal:        General: Normal range of motion.  Skin:    General: Skin is warm.  Neurological:     Mental Status: He is alert.     Neurovascular status found to be intact muscle strength was adequate range of motion adequate with moderate to significant discomfort in the right plantar fascia distal to the insertion calcaneus.  Patient is noted to have fluid buildup in the area and does have depression of the arch bilateral chronic in nature with good digital perfusion well-oriented x 3     Assessment:  Plantar fasciitis that is increasing right with long-term history of chronic plantar fasciitis controlled with orthotics which have lost their support     Plan:  H&P x-rays reviewed and today I went ahead and did sterile prep injected the medial band of the fascia distal to the insertion 3 mg dexamethasone Kenalog  5 mg Xylocaine  advised on stretch and casted for functional orthotics after pedorthist evaluated patient and made adjustments.  Patient to be seen back to recheck  X-rays indicate that there is no signs of spur moderate depression of the arch

## 2024-07-08 ENCOUNTER — Other Ambulatory Visit

## 2024-07-16 ENCOUNTER — Ambulatory Visit

## 2024-07-16 NOTE — Progress Notes (Signed)
 Patient presents today to pick up custom molded foot orthotics, diagnosed with PF by Dr. Magdalen .   Orthotics were dispensed and fit was satisfactory. Reviewed instructions for break-in and wear. Written instructions given to patient.  Patient will follow up as needed.
